# Patient Record
Sex: Female | Born: 1960 | Race: Black or African American | Hispanic: No | Marital: Married | Smoking: Never smoker
Health system: Southern US, Community
[De-identification: ages and names within clinical notes are randomized; demographics above are authoritative.]

## PROBLEM LIST (undated history)

## (undated) DIAGNOSIS — E785 Hyperlipidemia, unspecified: Secondary | ICD-10-CM

## (undated) DIAGNOSIS — M1990 Unspecified osteoarthritis, unspecified site: Secondary | ICD-10-CM

## (undated) HISTORY — DX: Hyperlipidemia, unspecified: E78.5

## (undated) HISTORY — DX: Unspecified osteoarthritis, unspecified site: M19.90

---

## 2001-12-01 ENCOUNTER — Other Ambulatory Visit: Admission: RE | Admit: 2001-12-01 | Discharge: 2001-12-01 | Payer: Self-pay | Admitting: Gynecology

## 2003-02-12 ENCOUNTER — Other Ambulatory Visit: Admission: RE | Admit: 2003-02-12 | Discharge: 2003-02-12 | Payer: Self-pay | Admitting: Gynecology

## 2005-08-14 ENCOUNTER — Other Ambulatory Visit: Admission: RE | Admit: 2005-08-14 | Discharge: 2005-08-14 | Payer: Self-pay | Admitting: Family Medicine

## 2005-08-14 ENCOUNTER — Ambulatory Visit: Payer: Self-pay | Admitting: Family Medicine

## 2005-11-17 ENCOUNTER — Ambulatory Visit: Payer: Self-pay | Admitting: Family Medicine

## 2005-11-27 ENCOUNTER — Ambulatory Visit: Payer: Self-pay | Admitting: Family Medicine

## 2006-01-08 ENCOUNTER — Ambulatory Visit: Payer: Self-pay | Admitting: Family Medicine

## 2007-06-10 ENCOUNTER — Encounter: Payer: Self-pay | Admitting: Family Medicine

## 2007-08-12 ENCOUNTER — Encounter: Payer: Self-pay | Admitting: Family Medicine

## 2007-09-22 ENCOUNTER — Encounter: Payer: Self-pay | Admitting: Family Medicine

## 2007-09-22 ENCOUNTER — Ambulatory Visit: Payer: Self-pay | Admitting: Family Medicine

## 2007-09-22 ENCOUNTER — Other Ambulatory Visit: Admission: RE | Admit: 2007-09-22 | Discharge: 2007-09-22 | Payer: Self-pay | Admitting: Family Medicine

## 2007-09-22 DIAGNOSIS — M069 Rheumatoid arthritis, unspecified: Secondary | ICD-10-CM

## 2007-09-27 LAB — CONVERTED CEMR LAB: Pap Smear: NORMAL

## 2007-10-25 ENCOUNTER — Encounter: Payer: Self-pay | Admitting: Family Medicine

## 2007-10-26 ENCOUNTER — Encounter: Payer: Self-pay | Admitting: Family Medicine

## 2007-10-26 LAB — CONVERTED CEMR LAB: LDL Goal: 160 mg/dL

## 2008-01-10 ENCOUNTER — Encounter: Payer: Self-pay | Admitting: Family Medicine

## 2009-04-18 ENCOUNTER — Encounter: Payer: Self-pay | Admitting: Family Medicine

## 2009-09-09 ENCOUNTER — Ambulatory Visit: Payer: Self-pay | Admitting: Family Medicine

## 2009-09-09 DIAGNOSIS — M25569 Pain in unspecified knee: Secondary | ICD-10-CM | POA: Insufficient documentation

## 2009-09-11 ENCOUNTER — Encounter: Payer: Self-pay | Admitting: Family Medicine

## 2009-09-11 ENCOUNTER — Encounter: Admission: RE | Admit: 2009-09-11 | Discharge: 2009-09-11 | Payer: Self-pay | Admitting: Sports Medicine

## 2009-09-26 ENCOUNTER — Encounter: Payer: Self-pay | Admitting: Family Medicine

## 2010-02-27 ENCOUNTER — Ambulatory Visit: Payer: Self-pay | Admitting: Family Medicine

## 2010-02-27 DIAGNOSIS — E785 Hyperlipidemia, unspecified: Secondary | ICD-10-CM

## 2010-04-03 ENCOUNTER — Ambulatory Visit: Payer: Self-pay | Admitting: Family Medicine

## 2010-05-01 ENCOUNTER — Ambulatory Visit: Payer: Self-pay | Admitting: Family Medicine

## 2010-05-01 DIAGNOSIS — R5383 Other fatigue: Secondary | ICD-10-CM

## 2010-05-01 DIAGNOSIS — R5381 Other malaise: Secondary | ICD-10-CM

## 2010-05-02 LAB — CONVERTED CEMR LAB
ALT: 28 units/L (ref 0–35)
Albumin: 4.2 g/dL (ref 3.5–5.2)
Alkaline Phosphatase: 58 units/L (ref 39–117)
BUN: 12 mg/dL (ref 6–23)
CO2: 23 meq/L (ref 19–32)
Calcium: 9.6 mg/dL (ref 8.4–10.5)
Chloride: 105 meq/L (ref 96–112)
Glucose, Bld: 105 mg/dL — ABNORMAL HIGH (ref 70–99)
HDL: 47 mg/dL (ref >39)
Hemoglobin: 14.2 g/dL (ref 12.0–15.0)
MCHC: 32.6 g/dL (ref 30.0–36.0)
MCV: 95.4 fL (ref 78.0–100.0)
Platelets: 264 10*3/uL (ref 150–400)
RDW: 13.3 % (ref 11.5–15.5)
Sodium: 138 meq/L (ref 135–145)
TSH: 1.042 microintl units/mL (ref 0.350–4.500)
Total Bilirubin: 0.3 mg/dL (ref 0.3–1.2)
Total Protein: 7.7 g/dL (ref 6.0–8.3)
Triglycerides: 209 mg/dL — ABNORMAL HIGH (ref <150)

## 2010-06-02 ENCOUNTER — Ambulatory Visit: Payer: Self-pay | Admitting: Family Medicine

## 2010-07-01 ENCOUNTER — Ambulatory Visit: Payer: Self-pay | Admitting: Family Medicine

## 2010-07-02 LAB — CONVERTED CEMR LAB: Direct LDL: 132 mg/dL — ABNORMAL HIGH

## 2010-07-31 ENCOUNTER — Encounter: Payer: Self-pay | Admitting: Family Medicine

## 2010-12-25 NOTE — Assessment & Plan Note (Signed)
Summary: wt / BP check  Nurse Visit   Vital Signs:  Patient profile:   50 year old female Height:      64 inches Weight:      222 pounds Pulse rate:   100 / minute BP sitting:   119 / 81  (left arm) Cuff size:   large  Vitals Entered By: Kathlene November (June 02, 2010 8:34 AM) CC: weight and BP check. Pt wants to know if you can increase the Phenteramine from 15mg  to the next higher dose- noticing not curbing appetite as well. [facility] [address]   Allergies: No Known Drug Allergies  Orders Added: 1)  Est. Patient Level I [[address]109] Prescriptions: PHENTERMINE HCL 37.5 MG TABS (PHENTERMINE HCL) 1 tab by mouth qAM; take 30 min before breakfast  #30 x 0   Entered and Authorized by:   Seymour Bars DO   Signed by:   Seymour Bars DO on 06/02/2010   Method used:   Printed then faxed to ...       [facility]  [address] [phone]* (retail)       [address] [place], [place]  [address]       Ph: [phone] or [phone]       Fax: [phone]   RxID:   [id number]     Impression & Recommendations:  Problem # 1:  OBESITY, CLASS III (ICD-278.01) Lost another 7 # in 1 month with help of phentermine to diet/ exercise. She wants higher dose as the effects are plateuing.  Will increase her to 37.5 mg once a day. RTC for nurse BP/ wt check in 1 month. F/U Dr Cathey Endow in 2 mos.  Complete Medication List: 1)  Folic Acid 1 Mg Tabs (Folic acid) .... One tab by mouth once a week 2)  Methotrexate 2.5 Mg Tabs (Methotrexate sodium) .... Take 8 tabs every thursday. 3)  Humira Pen 40 Mg/0.88ml Kit (Adalimumab) .... Inj every 2 weeks. 4)  Phentermine Hcl 37.5 Mg Tabs (Phentermine hcl) .Marland Kitchen.. 1 tab by mouth qam; take 30 min before breakfast 5)  Tramadol Hcl 50 Mg Tabs (Tramadol hcl) .Marland Kitchen.. 1 tab by mouth once a day as needed for joint pain 6)  Crestor 10 Mg Tabs (Rosuvastatin calcium) .Marland Kitchen.. 1 tab by mouth qhs

## 2010-12-25 NOTE — Assessment & Plan Note (Signed)
Summary: f/u weight   Vital Signs:  Patient profile:   50 year old female Height:      64 inches Weight:      229 pounds BMI:     39.45 O2 Sat:      99 % on Room air Pulse rate:   76 / minute BP sitting:   138 / 85  (left arm) Cuff size:   large  Vitals Entered By: Payton Spark CMA (May 01, 2010 8:33 AM)  O2 Flow:  Room air CC: F/U weight   Primary Care Provider:  Nani Gasser MD  CC:  F/U weight.  History of Present Illness: 50 yo AAF presents for f/u weight.  She has been on Phentermine x 2 mos now.  She has lost about 19 lbs thus far.  She has noticed that she is able to do portion control and has been eating healthier and is more physically active.  She is doing squats at work.  She denies any insomnia, tremor or heart palptitations.      Current Medications (verified): 1)  Folic Acid 1 Mg  Tabs (Folic Acid) .... One Tab By Mouth Once A Week 2)  Methotrexate 2.5 Mg  Tabs (Methotrexate Sodium) .... Take 8 Tabs Every Thursday. 3)  Humira Pen 40 Mg/0.58ml Kit (Adalimumab) .... Inj Every 2 Weeks. 4)  Phentermine Hcl 15 Mg Caps (Phentermine Hcl) .Marland Kitchen.. 1 Capsule By Mouth Qam, Take 30 Min Before Breakfast 5)  Tramadol Hcl 50 Mg Tabs (Tramadol Hcl) .Marland Kitchen.. 1 Tab By Mouth Once A Day As Needed For Joint Pain  Allergies (verified): No Known Drug Allergies  Physical Exam  General:  alert, well-developed, well-nourished, and well-hydrated.  obese Head:  normocephalic and atraumatic.   Lungs:  Normal respiratory effort, chest expands symmetrically. Lungs are clear to auscultation, no crackles or wheezes. Heart:  Normal rate and regular rhythm. S1 and S2 normal without gallop, murmur, click, rub or other extra sounds. Neurologic:  no tremor Skin:  color normal.   Psych:  good eye contact, not anxious appearing, and not depressed appearing.     Impression & Recommendations:  Problem # 1:  DYSLIPIDEMIA (ICD-272.4)  Update fasting labs today to see if wt loss has  helped improve her numbers.  She is NOT taking her statin. The following medications were removed from the medication list:    Crestor 10 Mg Tabs (Rosuvastatin calcium) .Marland Kitchen... Take 1 tablet by mouth once a day at bedtime  Lipid Goals: Chol Goal: 200 (10/26/2007)   HDL Goal: 40 (10/26/2007)   LDL Goal: 160 (10/26/2007)   TG Goal: 150 (10/26/2007)  Prior 10 Yr Risk Heart Disease: 4 % (10/26/2007)   HDL:44 (10/25/2007)  LDL:174 (10/25/2007)  Chol:264 (10/25/2007)  Trig:228 (10/25/2007)  Orders: T-Lipid Profile (11914-78295)  Problem # 2:  OBESITY, CLASS III (ICD-278.01) BMI 39 c/w class II obesity, total wt loss of 19 lbs in 2 mos with addition of Phentermine to healthy diet and regular exercise.  BP at goal and doing quite well so will continue Phentermine at 15 mg once a day. Nurse BP / wt check in 1 month.  Complete Medication List: 1)  Folic Acid 1 Mg Tabs (Folic acid) .... One tab by mouth once a week 2)  Methotrexate 2.5 Mg Tabs (Methotrexate sodium) .... Take 8 tabs every thursday. 3)  Humira Pen 40 Mg/0.34ml Kit (Adalimumab) .... Inj every 2 weeks. 4)  Phentermine Hcl 15 Mg Caps (Phentermine hcl) .Marland Kitchen.. 1 capsule by mouth  qam, take 30 min before breakfast 5)  Tramadol Hcl 50 Mg Tabs (Tramadol hcl) .Marland Kitchen.. 1 tab by mouth once a day as needed for joint pain  Other Orders: T-Comprehensive Metabolic Panel ([id number]) T-TSH (09811-91478) T-CBC No Diff ([id number])  Patient Instructions: 1)  Phentermine RFd. 2)  Continue to work on healthy diet- 3)  Focus on [employer] Proteins, fruits and veggies. 4)  Avoid snacking and sugary drinks. 5)  Return for a weight check in 1 month (nurse visit). 6)  Labs today. 7)  Will call you w/ results tomorrow. Prescriptions: PHENTERMINE HCL 15 MG CAPS (PHENTERMINE HCL) 1 capsule by mouth qAM, take 30 min before breakfast  #30 x 0   Entered and Authorized by:   Seymour Bars DO   Signed by:   Seymour Bars DO on 05/01/2010   Method used:   Printed then  faxed to ...       [facility]  [address] [phone]* (retail)       [address] [place], [place]  [address]       Ph: [phone] or [phone]       Fax: [phone]   RxID:   [id number]

## 2010-12-25 NOTE — Letter (Signed)
Summary: [facility]  [facility]   Imported By: Lanelle Bal 08/19/2010 12:04:39  _____________________________________________________________________  External Attachment:    Type:   Image     Comment:   External Document

## 2010-12-25 NOTE — Assessment & Plan Note (Signed)
Summary: weight and BP check//mpm  Nurse Visit   Vital Signs:  Patient profile:   50 year old female Height:      64 inches Weight:      219 pounds BMI:     37.73 O2 Sat:      99 % on Room air Pulse rate:   88 / minute BP sitting:   124 / 71  (right arm) Cuff size:   large  Vitals Entered By: Payton Spark CMA (July 01, 2010 9:54 AM)  O2 Flow:  Room air CC: F/U weight and HTN.   Current Medications (verified): 1)  Folic Acid 1 Mg  Tabs (Folic Acid) .... One Tab By Mouth Once A Week 2)  Methotrexate 2.5 Mg  Tabs (Methotrexate Sodium) .... Take 8 Tabs Every Thursday. 3)  Humira Pen 40 Mg/0.79ml Kit (Adalimumab) .... Inj Every 2 Weeks. 4)  Phentermine Hcl 37.5 Mg Tabs (Phentermine Hcl) .Marland Kitchen.. 1 Tab By Mouth Qam; Take 30 Min Before Breakfast 5)  Tramadol Hcl 50 Mg Tabs (Tramadol Hcl) .Marland Kitchen.. 1 Tab By Mouth Once A Day As Needed For Joint Pain 6)  Crestor 10 Mg Tabs (Rosuvastatin Calcium) .Marland Kitchen.. 1 Tab By Mouth Qhs  Allergies (verified): No Known Drug Allergies  Orders Added: 1)  Est. Patient Level I [84696] 2)  T-Lipoprotein (LDL cholesterol)  [29528-41324] 3)  T-AST/SGOT [40102-72536] 4)  T-ALT/SGPT [64403-47425] Prescriptions: PHENTERMINE HCL 37.5 MG TABS (PHENTERMINE HCL) 1 tab by mouth qAM; take 30 min before breakfast  #30 x 0   Entered and Authorized by:   Seymour Bars DO   Signed by:   Seymour Bars DO on 07/01/2010   Method used:   Printed then faxed [facility] ...       [facility]  [address] [phone]* (retail)       [address] [place], [place]  [address]       Ph: [phone] or [phone]       Fax: [phone]   RxID:   [id number]     Patient Instructions: 1)  Phentermine RFd for another month. 2)  3 # lost in the last month. 3)  Expect at least 1 # lost/ wk. 4)  Work on healthy diet and 1 hr of exercise 5 days/ wk. 5)  Labs today. 6)  Will call you w/ results tomorrow. 7)  If you have not lost at least 4# at [facility] / BP check next month, will discontinue  Phentermine.

## 2010-12-25 NOTE — Assessment & Plan Note (Signed)
Summary: Wt check  Nurse Visit   Vital Signs:  Patient profile:   50 year old female Height:      64 inches Weight:      232 pounds BMI:     39.97 Pulse rate:   69 / minute BP sitting:   135 / 77  (left arm) Cuff size:   large  Vitals Entered By: Payton Spark CMA (Apr 03, 2010 10:07 AM)  Allergies: No Known Drug Allergies  Orders Added: 1)  Est. Patient Level I [16109] Prescriptions: PHENTERMINE HCL 15 MG CAPS (PHENTERMINE HCL) 1 capsule by mouth qAM, take 30 min before breakfast  #30 x 0   Entered and Authorized by:   Seymour Bars DO   Signed by:   Seymour Bars DO on 04/03/2010   Method used:   Printed then faxed [facility] ...       [facility]  [address] [phone]* (retail)       [address] [place], [place]  [address]       Ph: [phone] or [phone]       Fax: [phone]   RxID:   [id number]    CC: Follow-up HTN HPI: Taking meds? yes Side effects? no Chest pain, SOB, Dizziness? no A/P: HTN (401.1)  At goal? If no, Patient will be notified.  5 minutes was spent with patient.    Impression & Recommendations:  Problem # 1:  OBESITY, CLASS III (ICD-278.01) Excellent wt loss 243--> 232 in 1 month on Phentermine 15 mg/ day.  Doing great w/o SEs.   BP at goal.  11 lbs lost thus far.  OV in 1 mo.  Complete Medication List: 1)  Folic Acid 1 Mg Tabs (Folic acid) .... One tab by mouth once a week 2)  Methotrexate 2.5 Mg Tabs (Methotrexate sodium) .... Take 8 tabs every thursday. 3)  Crestor 10 Mg Tabs (Rosuvastatin calcium) .... Take 1 tablet by mouth once a day at bedtime 4)  Humira Pen 40 Mg/0.22ml Kit (Adalimumab) .... Inj every 2 weeks. 5)  Phentermine Hcl 15 Mg Caps (Phentermine hcl) .Marland Kitchen.. 1 capsule by mouth qam, take 30 min before breakfast 6)  Tramadol Hcl 50 Mg Tabs (Tramadol hcl) .Marland Kitchen.. 1 tab by mouth once a day as needed for joint pain  Appended Document: Wt check Pt aware

## 2010-12-25 NOTE — Assessment & Plan Note (Signed)
Summary: weight mgmt   Vital Signs:  Patient profile:   50 year old female Height:      64 inches Weight:      243 pounds BMI:     41.86 O2 Sat:      98 % on Room air Pulse rate:   80 / minute BP sitting:   129 / 79  (left arm) Cuff size:   large  Vitals Entered By: Payton Spark CMA (February 27, 2010 9:50 AM)  O2 Flow:  Room air CC: Weight management. Would also like to know if Crestor can be changed to simvastatin.    Primary Care Provider:  Nani Gasser MD  CC:  Weight management. Would also like to know if Crestor can be changed to simvastatin. Marland Kitchen  History of Present Illness: 50 yo AAF presents for weight managment.  She has struggled with her weight mainly since pregnancy in the mid 1980s.  Her weight history is as follows:  In 6th grade 143 in HS 183 In college lost wt. 1985- 189 at the end of pregnancy.  did not lose baby weight.   then 8 lbs/ year gain ever since.    She has knee pain from RA.  This limits her ability to exercise.  She really enjoys walking.  Has not tried to pre-treat with NSAIDs or tylenol or use a knee brace.  Denies limping.  Her husband is trying to eat healither.  She has tried Weight Watchers in the past, OTC diet pills.  Has used diuretics.  She is ready to really get some weight off as she is aware of the health effects.    Has hx of dyslipidemia.   Current Medications (verified): 1)  Folic Acid 1 Mg  Tabs (Folic Acid) .... One Tab By Mouth Once A Week 2)  Methotrexate 2.5 Mg  Tabs (Methotrexate Sodium) .... Take 8 Tabs Every Thursday. 3)  Crestor 10 Mg  Tabs (Rosuvastatin Calcium) .... Take 1 Tablet By Mouth Once A Day At Bedtime 4)  Humira Pen 40 Mg/0.72ml Kit (Adalimumab) .... Inj Every 2 Weeks.  Allergies (verified): No Known Drug Allergies  Past History:  Past Medical History: Reviewed history from 09/22/2007 and no changes required. Iritis from RA.   Social History: Reviewed history from 09/22/2007 and no changes  required. 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  Married to [employer].  48 yr old son.  Never smoked.  No EtOH, no drugs, 2 caffeinated drinks per day.  Walks 1x per week for 40 min.  No birth control, had great difficulty conceiving.  Review of Systems General:  Denies fatigue, loss of appetite, and sleep disorder. CV:  Denies chest pain or discomfort, palpitations, shortness of breath with exertion, and swelling of feet. Resp:  Denies cough. GI:  Denies abdominal pain, indigestion, and nausea. MS:  Complains of joint pain. Psych:  Denies depression.  Physical Exam  General:  obese WF in NAD Head:  normocephalic and atraumatic.   Eyes:  wears glasses Nose:  no nasal discharge.   Mouth:  pharynx pink and moist.   Neck:  no masses.   Lungs:  Normal respiratory effort, chest expands symmetrically. Lungs are clear to auscultation, no crackles or wheezes. Heart:  Normal rate and regular rhythm. S1 and S2 normal without gallop, murmur, click, rub or other extra sounds. Abdomen:  soft, non-tender, no masses, no hepatomegaly, and no splenomegaly.   Pulses:  2+ radial and pedal pulses Extremities:  no E/C/C Skin:  color normal.   Psych:  good eye contact, not anxious appearing, and not depressed appearing.     Impression & Recommendations:  Problem # 1:  DYSLIPIDEMIA (ICD-272.4) Reviewed labs from 08 with FLP c/w metabolic syndrome -- high TGs, low HDL.   She wants to try weight loss x 8 wks prior to rechecking fasting sugar and lipids.   Update labs at 2 mos f/u visit.  Her updated medication list for this problem includes:    Crestor 10 Mg Tabs (Rosuvastatin calcium) .Marland Kitchen... Take 1 tablet by mouth once a day at bedtime  Problem # 2:  OBESITY, CLASS III (ICD-278.01) BMI 41 c/w class III obesity.  counseled 20 min face to face on weight loss management plan, obtacles to weight loss. She agrees to start walking 3+ days/ wk for 45+ minutes and can pre-treat with Ibuprofen 600 mg 30 min  prior to her walk and use a knee sleeve for RA knee pain.  She can mix it up with use of recumbent bike, elliptical or water aerobics at the gym to fill in 5 days/ wk with exercise.  We reviewed a plan to change highest calorie meals to the AM and eat a light dinner, avoiding late night snacks.  EKG today shows a normal QT interval, NSR, no ischmeia, normal axis.  She agrees to try Phentermine as appetite suppressant in the AM.  Call if any problems.  RTC for nurse visit - wt and BP check in 4 wks with OV in 8 wks. Orders: EKG w/ Interpretation (93000)  Complete Medication List: 1)  Folic Acid 1 Mg Tabs (Folic acid) .... One tab by mouth once a week 2)  Methotrexate 2.5 Mg Tabs (Methotrexate sodium) .... Take 8 tabs every thursday. 3)  Crestor 10 Mg Tabs (Rosuvastatin calcium) .... Take 1 tablet by mouth once a day at bedtime 4)  Humira Pen 40 Mg/0.57ml Kit (Adalimumab) .... Inj every 2 weeks. 5)  Phentermine Hcl 15 Mg Caps (Phentermine hcl) .Marland Kitchen.. 1 capsule by mouth qam, take 30 min before breakfast 6)  Tramadol Hcl 50 Mg Tabs (Tramadol hcl) .Marland Kitchen.. 1 tab by mouth once a day as needed for joint pain  Patient Instructions: 1)  EKG normal. 2)  Start Phentermine 15 mg every AM, take on empty stomach 30 min before breakast. 3)  Work on getting 45+ min of exercise 5 days/ wk. 4)  Use Small plate sizes. 5)  Eat a small dinner and avoid eating > 2 hrs before going to bed. 6)  Avoid any sweetened drinks including juice. 7)  Work on eating lean protein at each meal. 8)  Avoid any snacking. 9)  RTC for nurse BP/ wt check in 4 wks. 10)  Return to see me in 2 mos. Prescriptions: TRAMADOL HCL 50 MG TABS (TRAMADOL HCL) 1 tab by mouth once a day as needed for joint pain  #30 x 1   Entered and Authorized by:   Seymour Bars DO   Signed by:   Seymour Bars DO on 02/27/2010   Method used:   [facility]        [facility]  [address] [phone][facility] (retail)       [address] [place], [place]  [address]        Ph: [phone] or [phone]       Fax: [phone]   RxID:   [id number] PHENTERMINE HCL 15 MG CAPS (PHENTERMINE HCL) 1 capsule by mouth qAM,  take 30 min before breakfast  #30 x 0   Entered and Authorized by:   Seymour Bars DO   Signed by:   Seymour Bars DO on 02/27/2010   Method used:   Printed then faxed to ...       [facility]  [address] [phone]* (retail)       [address] [place], [place]  [address]       Ph: [phone] or [phone]       Fax: [phone]   RxID:   [id number]

## 2011-04-07 ENCOUNTER — Encounter: Payer: Self-pay | Admitting: Family Medicine

## 2012-09-08 ENCOUNTER — Telehealth: Payer: Self-pay | Admitting: Family Medicine

## 2012-09-08 ENCOUNTER — Other Ambulatory Visit (HOSPITAL_COMMUNITY)
Admission: RE | Admit: 2012-09-08 | Discharge: 2012-09-08 | Disposition: A | Discharge status: Home / Self Care | Payer: Managed Care, Other (non HMO) | Source: Ambulatory Visit | Attending: Family Medicine | Admitting: Family Medicine

## 2012-09-08 ENCOUNTER — Ambulatory Visit (INDEPENDENT_AMBULATORY_CARE_PROVIDER_SITE_OTHER): Payer: Managed Care, Other (non HMO) | Admitting: Family Medicine

## 2012-09-08 VITALS — BP 183/88 | HR 73 | Ht 64.0 in | Wt 257.0 lb

## 2012-09-08 DIAGNOSIS — Z1231 Encounter for screening mammogram for malignant neoplasm of breast: Secondary | ICD-10-CM

## 2012-09-08 DIAGNOSIS — Z01419 Encounter for gynecological examination (general) (routine) without abnormal findings: Secondary | ICD-10-CM | POA: Insufficient documentation

## 2012-09-08 DIAGNOSIS — M069 Rheumatoid arthritis, unspecified: Secondary | ICD-10-CM

## 2012-09-08 DIAGNOSIS — Z1151 Encounter for screening for human papillomavirus (HPV): Secondary | ICD-10-CM | POA: Insufficient documentation

## 2012-09-08 DIAGNOSIS — H9319 Tinnitus, unspecified ear: Secondary | ICD-10-CM

## 2012-09-08 DIAGNOSIS — N912 Amenorrhea, unspecified: Secondary | ICD-10-CM

## 2012-09-08 MED ORDER — AMOXICILLIN-POT CLAVULANATE 875-125 MG PO TABS: 1.0000 | ORAL_TABLET | Freq: Two times a day (BID) | ORAL | Status: DC

## 2012-09-08 NOTE — Telephone Encounter (Signed)
Call patient and let her know that I want to recheck her blood pressure when she follows up for her ear in about 2 weeks. Make sure eating a low salt diet. Is very high today. Him and to address this to her during her visit and forgot.

## 2012-09-08 NOTE — Patient Instructions (Addendum)
Start a regular exercise program and make sure you are eating a healthy diet Try to eat 4 servings of dairy a day  Your vaccines are up to date.   

## 2012-09-08 NOTE — Progress Notes (Signed)
Subjective:     Janet Galloway is a 51 y.o. female and is here for a comprehensive physical exam. The patient reports no problems.  She has had some ringing in her left ear for at least the last month and a half. She says it feels somewhat like her ear is under water. She's also noticed that she'll have to move her phone to her right ear to be able to hear well on the speaker. She denies any actual pain fever or drainage from the ear.  Her last period was him as to year ago she has had some occasional spotting but not in a cyclical pattern. She wonders if she could be going through menopause. She's also due for her mammogram.  History   Social History  . Marital Status: Married    Spouse Name: N/A    Number of Children: N/A  . Years of Education: N/A   Occupational History  . Not on file.   Social History Main Topics  . Smoking status: Not on file  . Smokeless tobacco: Not on file  . Alcohol Use: Not on file  . Drug Use: Not on file  . Sexually Active: Not on file   Other Topics Concern  . Not on file   Social History Narrative  . No narrative on file   Health Maintenance  Topic Date Due  . Tetanus/tdap  11/07/1982  . Influenza Vaccine  07/24/2012  . Pap Smear  09/09/2015    The following portions of the patient's history were reviewed and updated as appropriate: allergies, current medications, past family history, past medical history, past social history, past surgical history and problem list.  Review of Systems A comprehensive review of systems was negative.   Objective:    BP 183/88  Pulse 73  Ht 5\' 4"  (1.626 m)  Wt 257 lb (116.574 kg)  BMI 44.11 kg/m2 General appearance: alert, cooperative, appears stated age and mildly obese Head: Normocephalic, without obvious abnormality, atraumatic Eyes: conj clear, EOMi, PEERLA Ears: Left Tm and canal is clear.  Right canal is clear.  Right TM with fluid, but no erythema or driange.  Good light reflex.   Nose:  Nares normal. Septum midline. Mucosa normal. No drainage or sinus tenderness. Throat: lips, mucosa, and tongue normal; teeth and gums normal Neck: no adenopathy, no carotid bruit, no JVD, supple, symmetrical, trachea midline and thyroid not enlarged, symmetric, no tenderness/mass/nodules Back: no kyphosis present, symmetric, no curvature. ROM normal. No CVA tenderness. Lungs: clear to auscultation bilaterally Breasts: normal appearance, no masses or tenderness Heart: regular rate and rhythm, S1, S2 normal, no murmur, click, rub or gallop Abdomen: soft, non-tender; bowel sounds normal; no masses,  no organomegaly Pelvic: cervix normal in appearance, external genitalia normal, no adnexal masses or tenderness, no cervical motion tenderness, rectovaginal septum normal, uterus normal size, shape, and consistency and vagina normal without discharge Extremities: extremities normal, atraumatic, no cyanosis or edema Pulses: 2+ and symmetric Skin: Skin color, texture, turgor normal. No rashes or lesions Lymph nodes: Cervical, supraclavicular, and axillary nodes normal. Neurologic: Alert and oriented X 3, normal strength and tone. Normal symmetric reflexes. Normal coordination and gait    Assessment:    Healthy female exam.      Plan:     See After Visit Summary for Counseling Recommendations  Start a regular exercise program and make sure you are eating a healthy diet Try to eat 4 servings of dairy a day  Your vaccines are up to  date.  Order placed for mammogram. Well do hormone testing to see if she is fully postmenopausal.   Left ear tinnitus-fluid on the left ear. No sign of infection or drainage.  She denies any allergy sxs. Tympanometry is abnormal in the left ear. We'll go ahead and treat with a trial of Augmentin. Follow up in 2 weeks to recheck your to see if the fluid is resolved.  Elevated blood pressure-she has no prior diagnosis. We'll recheck her blood pressure in 2 weeks for  followup.  Declined flu vaccine.

## 2012-09-09 ENCOUNTER — Encounter: Payer: Self-pay | Admitting: Family Medicine

## 2012-09-09 NOTE — Telephone Encounter (Signed)
Pt notiifed of MD instructions

## 2012-09-13 ENCOUNTER — Encounter: Payer: Self-pay | Admitting: *Deleted

## 2012-09-22 ENCOUNTER — Ambulatory Visit: Payer: Managed Care, Other (non HMO) | Admitting: Family Medicine

## 2012-09-22 DIAGNOSIS — Z0289 Encounter for other administrative examinations: Secondary | ICD-10-CM

## 2012-09-27 ENCOUNTER — Ambulatory Visit (INDEPENDENT_AMBULATORY_CARE_PROVIDER_SITE_OTHER): Payer: Managed Care, Other (non HMO)

## 2012-09-27 DIAGNOSIS — Z1231 Encounter for screening mammogram for malignant neoplasm of breast: Secondary | ICD-10-CM

## 2012-10-03 ENCOUNTER — Ambulatory Visit (INDEPENDENT_AMBULATORY_CARE_PROVIDER_SITE_OTHER): Payer: Managed Care, Other (non HMO) | Admitting: Family Medicine

## 2012-10-03 ENCOUNTER — Encounter: Payer: Self-pay | Admitting: Family Medicine

## 2012-10-03 VITALS — BP 141/85 | HR 75 | Ht 64.0 in | Wt 255.0 lb

## 2012-10-03 DIAGNOSIS — H938X9 Other specified disorders of ear, unspecified ear: Secondary | ICD-10-CM

## 2012-10-03 DIAGNOSIS — I10 Essential (primary) hypertension: Secondary | ICD-10-CM | POA: Insufficient documentation

## 2012-10-03 NOTE — Progress Notes (Signed)
  Subjective:    Patient ID: Janet Galloway, female    DOB: 1961-11-03, 51 y.o.   MRN: [id number]  HPI Here for followup blood pressure. She was seen a couple weeks ago during her annual physical exam. Her blood pressure was quite elevated with a systolic of 180. She has gained 40 pounds in the last year or so. She does feel it is most likely contributing factor. She also eats a very high salt diet. She wants and if she can retry phentermine for weight loss. She took it back in 2011 with Dr. Seymour Bars and did well on it.  She still having difficulty hearing out of her left ear. There is no pain but just feels plugged. Infection cannot wear her ear piece, ear during meetings because she cannot hear well.   Review of Systems     Objective:   Physical Exam  Constitutional: She appears well-developed and well-nourished.  HENT:       Left Tm appears normal. A little dull but she has a good light reflex unable to visualize the ossicle. No actual fluid level seen.  Cardiovascular: Normal rate, regular rhythm and normal heart sounds.   Pulmonary/Chest: Effort normal and breath sounds normal.  Skin: Skin is warm and dry.  Psychiatric: She has a normal mood and affect. Her behavior is normal.          Assessment & Plan:  Hypertension-uncontrolled. New diagnosis. We discussed the importance of getting this under control. We also discussed the DASH diet. Handout given. She wants to hold off on taking a blood pressure medication for now and really try to work on exercise and diet over the next month. Explained her that happy to report on the phentermine but that her blood pressure has been well controlled to start. Having elevated blood pressures contraindication and the phentermine can certainly make that worse because it is a stimulant. Followup in one month.  Left ear feeling plugged-I will refer her to ENT at this point. We did a course of Augmentin she did not improve. She denies any  allergy symptoms. I suggested possibly a nasal steroid spray and antihistamine but she feels strongly that she has not had any allergy symptoms to cause her ear fullness sensation. Referral made.

## 2012-10-03 NOTE — Patient Instructions (Signed)
DASH Diet  The DASH diet stands for "Dietary Approaches to Stop Hypertension." It is a healthy eating plan that has been shown to reduce high blood pressure (hypertension) in as little as 14 days, while also possibly providing other significant health benefits. These other health benefits include reducing the risk of breast cancer after menopause and reducing the risk of type 2 diabetes, heart disease, colon cancer, and stroke. Health benefits also include weight loss and slowing kidney failure in patients with chronic kidney disease.   DIET GUIDELINES  · Limit salt (sodium). Your diet should contain less than 1500 mg of sodium daily.  · Limit refined or processed carbohydrates. Your diet should include mostly whole grains. Desserts and added sugars should be used sparingly.  · Include small amounts of heart-healthy fats. These types of fats include nuts, oils, and tub margarine. Limit saturated and trans fats. These fats have been shown to be harmful in the body.  CHOOSING FOODS   The following food groups are based on a 2000 calorie diet. See your Registered Dietitian for individual calorie needs.  Grains and Grain Products (6 to 8 servings daily)  · Eat More Often: Whole-wheat bread, brown rice, whole-grain or wheat pasta, quinoa, popcorn without added fat or salt (air popped).  · Eat Less Often: White bread, white pasta, white rice, cornbread.  Vegetables (4 to 5 servings daily)  · Eat More Often: Fresh, frozen, and canned vegetables. Vegetables may be raw, steamed, roasted, or grilled with a minimal amount of fat.  · Eat Less Often/Avoid: Creamed or fried vegetables. Vegetables in a cheese sauce.  Fruit (4 to 5 servings daily)  · Eat More Often: All fresh, canned (in natural juice), or frozen fruits. Dried fruits without added sugar. One hundred percent fruit juice (½ cup [237 mL] daily).  · Eat Less Often: Dried fruits with added sugar. Canned fruit in light or heavy syrup.  Lean Meats, Fish, and Poultry (2  servings or less daily. One serving is 3 to 4 oz [85-114 g]).  · Eat More Often: Ninety percent or leaner ground beef, tenderloin, sirloin. Round cuts of beef, chicken breast, turkey breast. All fish. Grill, bake, or broil your meat. Nothing should be fried.  · Eat Less Often/Avoid: Fatty cuts of meat, turkey, or chicken leg, thigh, or wing. Fried cuts of meat or fish.  Dairy (2 to 3 servings)  · Eat More Often: Low-fat or fat-free milk, low-fat plain or light yogurt, reduced-fat or part-skim cheese.  · Eat Less Often/Avoid: Milk (whole, 2%). Whole milk yogurt. Full-fat cheeses.  Nuts, Seeds, and Legumes (4 to 5 servings per week)  · Eat More Often: All without added salt.  · Eat Less Often/Avoid: Salted nuts and seeds, canned beans with added salt.  Fats and Sweets (limited)  · Eat More Often: Vegetable oils, tub margarines without trans fats, sugar-free gelatin. Mayonnaise and salad dressings.  · Eat Less Often/Avoid: Coconut oils, palm oils, butter, stick margarine, cream, half and half, cookies, candy, pie.  FOR MORE INFORMATION  The Dash Diet Eating Plan: www.dashdiet.org  Document Released: 10/29/2011 Document Revised: 02/01/2012 Document Reviewed: 10/29/2011  ExitCare® Patient Information ©2013 [employer].  DASH Diet  The DASH diet stands for "Dietary Approaches to Stop Hypertension." It is a healthy eating plan that has been shown to reduce high blood pressure (hypertension) in as little as 14 days, while also possibly providing other significant health benefits. These other health benefits include reducing the risk of breast cancer after   menopause and reducing the risk of type 2 diabetes, heart disease, colon cancer, and stroke. Health benefits also include weight loss and slowing kidney failure in patients with chronic kidney disease.   DIET GUIDELINES  · Limit salt (sodium). Your diet should contain less than 1500 mg of sodium daily.  · Limit refined or processed carbohydrates. Your diet should include  mostly whole grains. Desserts and added sugars should be used sparingly.  · Include small amounts of heart-healthy fats. These types of fats include nuts, oils, and tub margarine. Limit saturated and trans fats. These fats have been shown to be harmful in the body.  CHOOSING FOODS   The following food groups are based on a 2000 calorie diet. See your Registered Dietitian for individual calorie needs.  Grains and Grain Products (6 to 8 servings daily)  · Eat More Often: Whole-wheat bread, brown rice, whole-grain or wheat pasta, quinoa, popcorn without added fat or salt (air popped).  · Eat Less Often: White bread, white pasta, white rice, cornbread.  Vegetables (4 to 5 servings daily)  · Eat More Often: Fresh, frozen, and canned vegetables. Vegetables may be raw, steamed, roasted, or grilled with a minimal amount of fat.  · Eat Less Often/Avoid: Creamed or fried vegetables. Vegetables in a cheese sauce.  Fruit (4 to 5 servings daily)  · Eat More Often: All fresh, canned (in natural juice), or frozen fruits. Dried fruits without added sugar. One hundred percent fruit juice (½ cup [237 mL] daily).  · Eat Less Often: Dried fruits with added sugar. Canned fruit in light or heavy syrup.  Lean Meats, Fish, and Poultry (2 servings or less daily. One serving is 3 to 4 oz [85-114 g]).  · Eat More Often: Ninety percent or leaner ground beef, tenderloin, sirloin. Round cuts of beef, chicken breast, turkey breast. All fish. Grill, bake, or broil your meat. Nothing should be fried.  · Eat Less Often/Avoid: Fatty cuts of meat, turkey, or chicken leg, thigh, or wing. Fried cuts of meat or fish.  Dairy (2 to 3 servings)  · Eat More Often: Low-fat or fat-free milk, low-fat plain or light yogurt, reduced-fat or part-skim cheese.  · Eat Less Often/Avoid: Milk (whole, 2%). Whole milk yogurt. Full-fat cheeses.  Nuts, Seeds, and Legumes (4 to 5 servings per week)  · Eat More Often: All without added salt.  · Eat Less Often/Avoid: Salted  nuts and seeds, canned beans with added salt.  Fats and Sweets (limited)  · Eat More Often: Vegetable oils, tub margarines without trans fats, sugar-free gelatin. Mayonnaise and salad dressings.  · Eat Less Often/Avoid: Coconut oils, palm oils, butter, stick margarine, cream, half and half, cookies, candy, pie.  FOR MORE INFORMATION  The Dash Diet Eating Plan: www.dashdiet.org  Document Released: 10/29/2011 Document Revised: 02/01/2012 Document Reviewed: 10/29/2011  ExitCare® Patient Information ©2013 [employer].

## 2012-10-10 ENCOUNTER — Encounter: Payer: Self-pay | Admitting: *Deleted

## 2012-11-08 ENCOUNTER — Encounter: Payer: Self-pay | Admitting: Family Medicine

## 2012-11-08 ENCOUNTER — Ambulatory Visit (INDEPENDENT_AMBULATORY_CARE_PROVIDER_SITE_OTHER): Payer: Managed Care, Other (non HMO) | Admitting: Family Medicine

## 2012-11-08 VITALS — BP 155/92 | HR 90 | Ht 64.0 in | Wt 257.0 lb

## 2012-11-08 DIAGNOSIS — E669 Obesity, unspecified: Secondary | ICD-10-CM

## 2012-11-08 DIAGNOSIS — I10 Essential (primary) hypertension: Secondary | ICD-10-CM

## 2012-11-08 MED ORDER — LISINOPRIL 10 MG PO TABS: 10.0000 mg | ORAL_TABLET | Freq: Every day | ORAL | Status: DC

## 2012-11-08 MED ORDER — PHENTERMINE HCL 37.5 MG PO CAPS: 37.5000 mg | ORAL_CAPSULE | ORAL | Status: DC

## 2012-11-08 NOTE — Progress Notes (Signed)
  Subjective:    Patient ID: Janet Galloway, female    DOB: 1961-02-18, 51 y.o.   MRN: [id number]  HPI Here to followup blood pressure. She says overall she's doing well she is asymptomatic. It was elevated at her last visit we did discuss the DASH diet. She went to work on this and then come back to every check her blood pressure. She has seen Dr. Genelle Bal, ENT since I last saw her. 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 They recommended steroid injections into the tympanic membrane into the middle ear versus actually getting hearing aid at some point if it gets worse. She says that when she was there his systolic blood pressure was in the 130s. She denies any chest pain or short of breath. She still wants to get back on phentermine for weight loss. She did very well with it previously the past with Dr. Seymour Bars. She was on 37.5 mg and had about a 40 pound weight loss with the medication. She says she does plan on starting a walking program daily in the mornings.    Review of Systems     Objective:   Physical Exam  Constitutional: She is oriented to person, place, and time. She appears well-developed and well-nourished.  HENT:  Head: Normocephalic and atraumatic.  Cardiovascular: Normal rate, regular rhythm and normal heart sounds.   Pulmonary/Chest: Effort normal and breath sounds normal.  Neurological: She is alert and oriented to person, place, and time.  Skin: Skin is warm and dry.  Psychiatric: She has a normal mood and affect. Her behavior is normal.          Assessment & Plan:  Hypertension-new diagnosis. We discussed the options of starting medication. Will start lisinopril 10 mg. We discussed potential side effects. Followup in one month to recheck blood pressure.  Obesity, class III-discussed starting phentermine. Went to her the importance of keeping her blood pressure control. We will recheck that on the phentermine in one month. She's  to stop immediately if she expenses any chest pain or shortness of breath. We also discussed potential side effects even though she has taken it before and tolerated it well or reviewed these with her again. If at that point her blood pressure looks great and she is losing weight and she has started a walking program and she can followup for monthly visits with the nurse. Did encourage her also get back on a dietary program to maximize her weight loss.  Goals: Daily walking in the morning, dietary change.

## 2012-11-18 ENCOUNTER — Other Ambulatory Visit: Payer: Self-pay | Admitting: *Deleted

## 2012-11-18 ENCOUNTER — Ambulatory Visit (INDEPENDENT_AMBULATORY_CARE_PROVIDER_SITE_OTHER): Payer: Managed Care, Other (non HMO)

## 2012-11-18 DIAGNOSIS — R748 Abnormal levels of other serum enzymes: Secondary | ICD-10-CM

## 2012-11-18 DIAGNOSIS — K8020 Calculus of gallbladder without cholecystitis without obstruction: Secondary | ICD-10-CM

## 2012-11-18 DIAGNOSIS — E7889 Other lipoprotein metabolism disorders: Secondary | ICD-10-CM

## 2012-12-09 ENCOUNTER — Encounter: Payer: Self-pay | Admitting: Family Medicine

## 2012-12-09 ENCOUNTER — Ambulatory Visit: Payer: Managed Care, Other (non HMO) | Admitting: Family Medicine

## 2012-12-09 ENCOUNTER — Ambulatory Visit (INDEPENDENT_AMBULATORY_CARE_PROVIDER_SITE_OTHER): Payer: Managed Care, Other (non HMO) | Admitting: Family Medicine

## 2012-12-09 VITALS — BP 132/80 | HR 96 | Resp 18 | Wt 243.0 lb

## 2012-12-09 DIAGNOSIS — I10 Essential (primary) hypertension: Secondary | ICD-10-CM

## 2012-12-09 DIAGNOSIS — E669 Obesity, unspecified: Secondary | ICD-10-CM

## 2012-12-09 MED ORDER — PHENTERMINE HCL 37.5 MG PO CAPS: 37.5000 mg | ORAL_CAPSULE | ORAL | Status: DC

## 2012-12-09 MED ORDER — LISINOPRIL 10 MG PO TABS: 10.0000 mg | ORAL_TABLET | Freq: Every day | ORAL | Status: DC

## 2012-12-09 NOTE — Progress Notes (Signed)
  Subjective:    Patient ID: Janet Galloway, female    DOB: 01/20/1961, 52 y.o.   MRN: [id number]  HPI HTN- Pt denies chest pain, SOB, dizziness, or heart palpitations.  Taking meds as directed w/o problems.  Denies medication side effects.  Has really but back on salt.    Obesity -  Unfortunately she injured her right knee and has not been able to walk. Noticed a little bit better what her to continue her momentum of weight loss.  She has been really working on controlling her diet and portion sizes. She reports treatment and plan fluids. In fact she's actually lost 14 pounds since I saw her a month ago. She denies any chest pain or shortness of breath or symptoms such as anxiousness on the phentermine.   Review of Systems     Objective:   Physical Exam  Constitutional: She is oriented to person, place, and time. She appears well-developed and well-nourished.  HENT:  Head: Normocephalic and atraumatic.  Cardiovascular: Normal rate, regular rhythm and normal heart sounds.   Pulmonary/Chest: Effort normal and breath sounds normal.  Neurological: She is alert and oriented to person, place, and time.  Skin: Skin is warm and dry.  Psychiatric: She has a normal mood and affect. Her behavior is normal.          Assessment & Plan:  HTN- Well controlled Tolerating ACEi well.  Followup in one month for blood pressure check. She's tolerating the medication well without side effects.  Obesity, class 3 - encouraged her to start an exercise routine. Unfortunately she injured her right knee and has not been able to walk. Noticed a little bit better what her to continue her momentum of weight loss. Continue with her dietary changes. Make sure drinking plenty water and fluids and avoiding dehydration. Followup in one month for nurse visit for blood pressure and weight check. Refill phentermine today.

## 2013-01-06 ENCOUNTER — Ambulatory Visit: Payer: Managed Care, Other (non HMO) | Admitting: Family Medicine

## 2013-01-18 ENCOUNTER — Ambulatory Visit (INDEPENDENT_AMBULATORY_CARE_PROVIDER_SITE_OTHER): Payer: Managed Care, Other (non HMO) | Admitting: Family Medicine

## 2013-01-18 ENCOUNTER — Encounter: Payer: Self-pay | Admitting: Family Medicine

## 2013-01-18 VITALS — BP 127/78 | HR 104 | Ht 64.0 in | Wt 237.0 lb

## 2013-01-18 DIAGNOSIS — E669 Obesity, unspecified: Secondary | ICD-10-CM

## 2013-01-18 DIAGNOSIS — R635 Abnormal weight gain: Secondary | ICD-10-CM

## 2013-01-18 MED ORDER — PHENTERMINE HCL 37.5 MG PO CAPS: 37.5000 mg | ORAL_CAPSULE | ORAL | Status: DC

## 2013-01-18 NOTE — Progress Notes (Signed)
  Subjective:    Patient ID: Janet Galloway, female    DOB: [birth year], 52 y.o.   MRN: [id number]  HPI F/U phentermine for weight loss. Tolerating well with no palpitaions, CP, SOB.  She has lost 6 more lbs.  No medication S.E.    Review of Systems     Objective:   Physical Exam        Assessment & Plan:  Abnormal weight gain - doing great. Down 6lbs. Feeling great. BP well controlled. F/U in 1 mo for BP /wt check.  Call if any concnerns. Make sure exercising.   Nani Gasser, MD

## 2013-05-17 ENCOUNTER — Encounter: Payer: Self-pay | Admitting: Family Medicine

## 2013-05-17 ENCOUNTER — Telehealth: Payer: Self-pay | Admitting: Family Medicine

## 2013-05-17 DIAGNOSIS — K760 Fatty (change of) liver, not elsewhere classified: Secondary | ICD-10-CM | POA: Insufficient documentation

## 2013-05-17 NOTE — Telephone Encounter (Signed)
Pt called and informed. She stated that she is going to look up this information on line and call back.Janet Galloway [place]

## 2013-05-17 NOTE — Telephone Encounter (Signed)
Please call patient and let her know because she does have fatty liver guidelines recommend that she have the hepatitis A vaccine. It is a two-part series vaccine. If she is interested then she can make a nurse visit to have this done or make an appointment with me if she would like to discuss it further.

## 2013-06-20 ENCOUNTER — Telehealth: Payer: Self-pay | Admitting: Family Medicine

## 2013-06-20 DIAGNOSIS — I10 Essential (primary) hypertension: Secondary | ICD-10-CM

## 2013-06-20 DIAGNOSIS — E785 Hyperlipidemia, unspecified: Secondary | ICD-10-CM

## 2013-06-20 NOTE — Telephone Encounter (Signed)
She really needs to go for labwork. I see was orderedin April and she never went.  Neesd CMP and lipids.

## 2013-06-21 NOTE — Addendum Note (Signed)
Addended by: Deno Etienne on: 06/21/2013 03:37 PM   Modules accepted: Orders

## 2013-06-21 NOTE — Telephone Encounter (Signed)
Left detailed message informing pt that she will need to go to the lab and have CMP and lipids done she will need to fast for 8 hours. She can ONLY have water, black coffee, or unsweetened tea NO SUGAR, CREAM OR ARTIFICIAL SWEETENERS.Loralee Pacas [place]

## 2013-08-04 ENCOUNTER — Other Ambulatory Visit: Payer: Self-pay | Admitting: Family Medicine

## 2013-08-09 ENCOUNTER — Telehealth: Payer: Self-pay | Admitting: *Deleted

## 2013-08-09 NOTE — Telephone Encounter (Signed)
lvm informing her that meds have been sent to pharmacy.Loralee Pacas [place]

## 2013-08-09 NOTE — Telephone Encounter (Signed)
Called with regards to her husbands meds stated that he has not received them they she gave me the mail orders phone and fax numbers. i will call to verify that rx have been filled and call her back.Loralee Pacas [place]  Ph.   [phone] Fax. [phone]

## 2013-11-20 ENCOUNTER — Other Ambulatory Visit: Payer: Self-pay | Admitting: Family Medicine

## 2013-11-23 HISTORY — PX: LAPAROSCOPIC GASTRIC SLEEVE RESECTION: SHX5895

## 2014-05-08 ENCOUNTER — Encounter: Payer: Managed Care, Other (non HMO) | Admitting: Family Medicine

## 2014-05-18 ENCOUNTER — Ambulatory Visit (INDEPENDENT_AMBULATORY_CARE_PROVIDER_SITE_OTHER): Payer: Managed Care, Other (non HMO) | Admitting: Family Medicine

## 2014-05-18 ENCOUNTER — Encounter: Payer: Self-pay | Admitting: Family Medicine

## 2014-05-18 ENCOUNTER — Telehealth: Payer: Self-pay | Admitting: Family Medicine

## 2014-05-18 VITALS — BP 131/77 | HR 83 | Ht 64.0 in | Wt 199.0 lb

## 2014-05-18 DIAGNOSIS — Z1211 Encounter for screening for malignant neoplasm of colon: Secondary | ICD-10-CM

## 2014-05-18 DIAGNOSIS — E785 Hyperlipidemia, unspecified: Secondary | ICD-10-CM

## 2014-05-18 DIAGNOSIS — Z0000 Encounter for general adult medical examination without abnormal findings: Secondary | ICD-10-CM

## 2014-05-18 DIAGNOSIS — L237 Allergic contact dermatitis due to plants, except food: Secondary | ICD-10-CM

## 2014-05-18 DIAGNOSIS — L255 Unspecified contact dermatitis due to plants, except food: Secondary | ICD-10-CM

## 2014-05-18 DIAGNOSIS — Z1231 Encounter for screening mammogram for malignant neoplasm of breast: Secondary | ICD-10-CM

## 2014-05-18 DIAGNOSIS — I10 Essential (primary) hypertension: Secondary | ICD-10-CM

## 2014-05-18 NOTE — Progress Notes (Signed)
Subjective:     Janet Galloway is a 53 y.o. female and is here for a comprehensive physical exam. The patient reports problems - She's been battling poison ivy on her arms and side abdomen and neck for a little over a week. She's been using some over-the-counter Caladryl lotion but is really not helping. His area on her right forearm that is weeping. She would like some relief with this. 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 She's doing fantastic with this and following with Dr. Seymour Bars at the bariatric clinic.Marland Kitchen  History   Social History  . Marital Status: Married    Spouse Name: N/A    Number of Children: N/A  . Years of Education: N/A   Occupational History  . Not on file.   Social History Main Topics  . Smoking status: Never Smoker   . Smokeless tobacco: Not on file  . Alcohol Use: No  . Drug Use: No  . Sexual Activity: Yes   Other Topics Concern  . Not on file   Social History Narrative  . No narrative on file   Health Maintenance  Topic Date Due  . Mammogram  11/07/2013  . Colonoscopy  11/07/2013  . Influenza Vaccine  06/23/2014  . Pap Smear  09/09/2015  . Tetanus/tdap  11/18/2015    The following portions of the patient's history were reviewed and updated as appropriate: allergies, current medications, past family history, past medical history, past social history, past surgical history and problem list.  Review of Systems A comprehensive review of systems was negative.   Objective:    BP 131/77  Pulse 83  Ht 5\' 4"  (1.626 m)  Wt 199 lb (90.266 kg)  BMI 34.14 kg/m2 General appearance: alert, cooperative and appears stated age Head: Normocephalic, without obvious abnormality, atraumatic Eyes: Conjunctiva clear, extra thin movements intact, pupils equal and reactive to light. Ears: normal TM's and external ear canals both ears Nose: Nares normal. Septum midline. Mucosa normal. No drainage or sinus  tenderness. Throat: lips, mucosa, and tongue normal; teeth and gums normal Neck: no adenopathy, no carotid bruit, no JVD, supple, symmetrical, trachea midline and thyroid not enlarged, symmetric, no tenderness/mass/nodules Back: symmetric, no curvature. ROM normal. No CVA tenderness. Lungs: clear to auscultation bilaterally Breasts: normal appearance, no masses or tenderness Heart: regular rate and rhythm, S1, S2 normal, no murmur, click, rub or gallop Abdomen: soft, non-tender; bowel sounds normal; no masses,  no organomegaly Extremities: extremities normal, atraumatic, no cyanosis or edema Pulses: 2+ and symmetric Skin: She has an erythematous raised rash on her right forearm, left forearm, neck, upper torso including over her right breast Lymph nodes: Cervical, supraclavicular, and axillary nodes normal. Neurologic: Alert and oriented X 3, normal strength and tone. Normal symmetric reflexes. Normal coordination and gait    Assessment:    Healthy female exam.      Plan:     See After Visit Summary for Counseling Recommendations  Keep up a regular exercise program and make sure you are eating a healthy diet Try to eat 4 servings of dairy a day, or if you are lactose intolerant take a calcium with vitamin D daily.  Your vaccines are up to date.   Discussed need for shingles vaccine. Handout provided. She will check with her insurance to see if it's covered.  Referral for mammogram place.  Discussed need for screening colonoscopy and she is now 53 years old. We'll place a referral.  Pamphlet provided..  Due for screening labwork.  status post bariatric surgery-she is now off of her blood pressure medication and her statin. I would like to recheck her lipids to make sure that they're still well controlled.   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 I want to avoid oral steroids because of her recent gastric bypass surgery.

## 2014-05-18 NOTE — Telephone Encounter (Signed)
Re: Referral for special screening for malignant neoplasms,colon I spoke with Misty Stanley at [facility].  Patient will need to have a family history of the above or something going on with her now for them to accept this referral.  Thank you.

## 2014-05-18 NOTE — Patient Instructions (Signed)
You should get a phone call from [facility] to schedule a screening colonoscopy. You should get a phone call from our imaging Department downstairs to schedule your mammogram. If your rash is not improving then please let me know.  Poison [employer] ivy is a inflammation of the skin (contact dermatitis) caused by touching the allergens on the leaves of the ivy plant following previous exposure to the plant. The rash usually appears 48 hours after exposure. The rash is usually bumps (papules) or blisters (vesicles) in a linear pattern. Depending on your own sensitivity, the rash may simply cause redness and itching, or it may also progress to blisters which may break open. These must be well cared for to prevent secondary bacterial (germ) infection, followed by scarring. Keep any open areas dry, clean, dressed, and covered with an antibacterial ointment if needed. The eyes may also get puffy. The puffiness is worst in the morning and gets better as the day progresses. This dermatitis usually heals without scarring, within 2 to 3 weeks without treatment. HOME CARE INSTRUCTIONS  Thoroughly wash with soap and water as soon as you have been exposed to poison ivy. You have about one half hour to remove the plant resin before it will cause the rash. This washing will destroy the oil or antigen on the skin that is causing, or will cause, the rash. Be sure to wash under your fingernails as any plant resin there will continue to spread the rash. Do not rub skin vigorously when washing affected area. Poison ivy cannot spread if no oil from the plant remains on your body. A rash that has progressed to weeping sores will not spread the rash unless you have not washed thoroughly. It is also important to wash any clothes you have been wearing as these may carry active allergens. The rash will return if you wear the unwashed clothing, even several days later. Avoidance of the plant in the future is the best measure.  Poison ivy plant can be recognized by the number of leaves. Generally, poison ivy has three leaves with flowering branches on a single stem. Diphenhydramine may be purchased over the counter and used as needed for itching. Do not drive with this medication if it makes you drowsy.Ask your caregiver about medication for children. SEEK MEDICAL CARE IF:  Open sores develop.  Redness spreads beyond area of rash.  You notice purulent (pus-like) discharge.  You have increased pain.  Other signs of infection develop (such as fever). Document Released: 11/06/2000 Document Revised: 02/01/2012 Document Reviewed: 09/25/2009 [facility] Patient Information 2015 [place], [place]. This information is not intended to replace advice given to you by your health care Dalayla Aldredge. Make sure you discuss any questions you have with your health care Dave Mannes.

## 2014-05-18 NOTE — Telephone Encounter (Signed)
Please call and let them know it is for a screening colonoscopy for age 53.

## 2014-05-18 NOTE — Telephone Encounter (Signed)
Note sent to Elease Hashimoto to call to verify.Loralee Pacas [place]

## 2014-05-21 NOTE — Telephone Encounter (Signed)
I called [facility] back they got the date of birth mixed up, birthdate they had  was for 53 year old.  Appt has been scheduled.

## 2014-06-04 DIAGNOSIS — K912 Postsurgical malabsorption, not elsewhere classified: Secondary | ICD-10-CM | POA: Insufficient documentation

## 2014-06-21 ENCOUNTER — Ambulatory Visit (INDEPENDENT_AMBULATORY_CARE_PROVIDER_SITE_OTHER): Payer: Managed Care, Other (non HMO)

## 2014-06-21 DIAGNOSIS — Z1231 Encounter for screening mammogram for malignant neoplasm of breast: Secondary | ICD-10-CM

## 2014-06-21 DIAGNOSIS — R928 Other abnormal and inconclusive findings on diagnostic imaging of breast: Secondary | ICD-10-CM

## 2014-06-25 ENCOUNTER — Other Ambulatory Visit: Payer: Self-pay | Admitting: Family Medicine

## 2014-06-25 DIAGNOSIS — R928 Other abnormal and inconclusive findings on diagnostic imaging of breast: Secondary | ICD-10-CM

## 2014-06-25 LAB — LIPID PANEL
Cholesterol: 188 mg/dL (ref 0–200)
HDL: 45 mg/dL (ref >39)
LDL CALC: 119 mg/dL — AB (ref 0–99)
Total CHOL/HDL Ratio: 4.2 Ratio
Triglycerides: 118 mg/dL (ref <150)
VLDL: 24 mg/dL (ref 0–40)

## 2014-06-25 LAB — COMPLETE METABOLIC PANEL WITH GFR
ALK PHOS: 85 U/L (ref 39–117)
ALT: 68 U/L — AB (ref 0–35)
AST: 60 U/L — AB (ref 0–37)
Albumin: 3.7 g/dL (ref 3.5–5.2)
BILIRUBIN TOTAL: 0.5 mg/dL (ref 0.2–1.2)
BUN: 10 mg/dL (ref 6–23)
CALCIUM: 9.7 mg/dL (ref 8.4–10.5)
CHLORIDE: 105 meq/L (ref 96–112)
CO2: 28 meq/L (ref 19–32)
CREATININE: 0.64 mg/dL (ref 0.50–1.10)
GFR, Est Non African American: >89 mL/min
GLUCOSE: 90 mg/dL (ref 70–99)
POTASSIUM: 3.9 meq/L (ref 3.5–5.3)
SODIUM: 139 meq/L (ref 135–145)
Total Protein: 6.6 g/dL (ref 6.0–8.3)

## 2014-06-26 ENCOUNTER — Other Ambulatory Visit: Payer: Self-pay | Admitting: *Deleted

## 2014-07-04 ENCOUNTER — Ambulatory Visit
Admission: RE | Admit: 2014-07-04 | Discharge: 2014-07-04 | Disposition: A | Discharge status: Home / Self Care | Payer: Private Health Insurance - Indemnity | Source: Ambulatory Visit | Attending: Family Medicine | Admitting: Family Medicine

## 2014-07-04 ENCOUNTER — Encounter (INDEPENDENT_AMBULATORY_CARE_PROVIDER_SITE_OTHER): Payer: Self-pay

## 2014-07-04 DIAGNOSIS — R928 Other abnormal and inconclusive findings on diagnostic imaging of breast: Secondary | ICD-10-CM

## 2015-06-21 ENCOUNTER — Ambulatory Visit (INDEPENDENT_AMBULATORY_CARE_PROVIDER_SITE_OTHER): Payer: Private Health Insurance - Indemnity | Admitting: Family Medicine

## 2015-06-21 ENCOUNTER — Encounter: Payer: Self-pay | Admitting: Family Medicine

## 2015-06-21 ENCOUNTER — Ambulatory Visit (INDEPENDENT_AMBULATORY_CARE_PROVIDER_SITE_OTHER): Payer: Managed Care, Other (non HMO)

## 2015-06-21 VITALS — BP 150/89 | HR 79 | Wt 196.0 lb

## 2015-06-21 DIAGNOSIS — M25561 Pain in right knee: Secondary | ICD-10-CM | POA: Diagnosis not present

## 2015-06-21 MED ORDER — NAPROXEN 500 MG PO TABS: 500.0000 mg | ORAL_TABLET | Freq: Two times a day (BID) | ORAL | Status: DC

## 2015-06-21 NOTE — Patient Instructions (Signed)
Thank you for coming in today. Take naproxen for pain.  Return in a few weeks if not better.   Patellofemoral Syndrome If you have had pain in the front of your knee for a long time, chances are good that you have patellofemoral syndrome. The word patella refers to the kneecap. Femoral (or femur) refers to the thigh bone. That is the bone the kneecap sits on. The kneecap is shaped like a triangle. Its job is to protect the knee and to improve the efficiency of your thigh muscles (quadriceps). The underside of the kneecap is made of smooth tissue (cartilage). This lets the kneecap slide up and down as the knee moves. Sometimes this cartilage becomes soft. Your healthcare provider may say the cartilage breaks down. That is patellofemoral syndrome. It can affect one knee, or both. The condition is sometimes called patellofemoral pain syndrome. That is because the condition is painful. The pain usually gets worse with activity. Sitting for a long time with the knee bent also makes the pain worse. It usually gets better with rest and proper treatment. CAUSES  No one is sure why some people develop this problem and others do not. Runners often get it. One name for the condition is "runner's knee." However, some people run for years and never have knee pain. Certain things seem to make patellofemoral syndrome more likely. They include:  Moving out of alignment. The kneecap is supposed to move in a straight line when the thigh muscle pulls on it. Sometimes the kneecap moves in poor alignment. That can make the knee swell and hurt. Some experts believe it also wears down the cartilage.  Injury to the kneecap.  Strain on the knee. This may occur during sports activity. Soccer, running, skiing and cycling can put excess stress on the knee.  Being flat-footed or knock-kneed. SYMPTOMS   Knee pain.  Pain under the kneecap. This is usually a dull, aching pain.  Pain in the knee when doing certain things:  squatting, kneeling, going up or down stairs.  Pain in the knee when you stand up after sitting down for awhile.  Tightness in the knee.  Loss of muscle strength in the thigh.  Swelling of the knee. DIAGNOSIS  Healthcare providers often send people with knee pain to an orthopedic caregiver. This person has special training to treat problems with bones and joints. To decide what is causing your knee pain, your caregiver will probably:  Do a physical exam. This will probably include:  Asking about symptoms you have noticed.  Asking about your activities and any injuries.  Feeling your knee. Moving it. This will help test the knee's strength. It will also check alignment (whether the knee and leg are aligned normally).  Order some tests, such as:  Imaging tests. They create pictures of the inside of the knee. Tests may include:  X-rays.  Computed tomography (CT) scan. This uses X-rays and a computer to show more detail.  Magnetic resonance imaging (MRI). This test uses magnets, radio waves and a computer to make pictures. TREATMENT   Medication is almost always used first. It can relieve pain. It also can reduce swelling. Non-steroidal anti-inflammatory medicines (called NSAIDs) are usually suggested. Sometimes a stronger form is needed. A stronger form would require a prescription.  Other treatment may be needed after the swelling goes down. Possibilities include:  Exercise. Certain exercises can make the muscles around the knee stronger which decreases the pressure on the knee cap. This includes the thigh  muscle. Certain exercises also may be suggested to increase your flexibility.  A knee brace. This gives the knee extra support and helps align the movement of the knee cap.  Orthotics. These are special shoe inserts. They can help keep your leg and knee aligned.  Surgery is sometimes needed. This is rare. Options include:  Arthroscopy. The surgeon uses a special tool to  remove any damaged pieces of the kneecap. Only a few small incisions (cuts) are needed.  Realignment. This is open surgery. The goals are to reduce pressure and fix the way the kneecap moves. HOME CARE INSTRUCTIONS   Take any medication prescribed by your healthcare provider. Follow the directions carefully.  If your knee is swollen:  Put ice or cold packs on it. Do this for 20 to 30 minutes, 3 to 4 times a day.  Keep the knee raised. Make sure it is supported. Put a pillow under it.  Rest your knee. For example, take the elevator instead of the stairs for awhile. Or, take a break from sports activity that strain your knee. Try walking or swimming instead.  Whenever you are active:  Use an elastic bandage on your knee. This gives it support.  After any activity, put ice or cold packs on your knees. Do this for about 10 to 20 minutes.  Make sure you wear shoes that give good support. Make sure they are not worn down. The heels should not slant in or out. SEEK MEDICAL CARE IF:   Knee pain gets worse. Or it does not go away, even after taking pain medicine.  Swelling does not go down.  Your thigh muscle becomes weak.  You have an oral temperature above 102 F (38.9 C). SEEK IMMEDIATE MEDICAL CARE IF:  You have an oral temperature above 102 F (38.9 C), not controlled by medicine. Document Released: 10/28/2009 Document Revised: 02/01/2012 Document Reviewed: 01/29/2014 [facility] Patient Information 2015 [place], [place]. This information is not intended to replace advice given to you by your health care provider. Make sure you discuss any questions you have with your health care provider.

## 2015-06-21 NOTE — Progress Notes (Signed)
Janet Galloway is a 54 y.o. female who presents to [facility]  today for right knee pain. Patient tripped and fell going up some stairs about a week ago landing on her anterior right knee. She notes anterior knee pain and some knee swelling. Pain is worse when she rises from a seated position and when she has her legs flexed for an extended period of time. She denies any locking catching or giving way. She has tried Tylenol Epsom salts hot and cold compression which a volatile and a little. She denies any radiating pain weakness or numbness fevers or chills. She works doing a Office manager.   Past Medical History  Diagnosis Date  . Hyperlipidemia   . Arthritis    Past Surgical History  Procedure Laterality Date  . 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 Smoking status: Never Smoker   . Smokeless tobacco: Not on file  . Alcohol Use: No   ROS as above Medications: Current Outpatient Prescriptions  Medication Sig Dispense Refill  . Methotrexate Sodium (METHOTREXATE PO) Take 8 tablets by mouth once a week.     . naproxen (NAPROSYN) 500 MG tablet Take 1 tablet (500 mg total) by mouth 2 (two) times daily with a meal. 30 tablet 0   No current facility-administered medications for this visit.   Allergies  Allergen Reactions  . Acetaminophen Itching    Had abnormal LFTs in past with using tylenol in large amounts for RA     Exam:  BP 150/89 mmHg  Pulse 79  Wt 196 lb (88.905 kg) Gen: Well NAD HEENT: EOMI,  MMM Lungs: Normal work of breathing. CTABL Heart: RRR no MRG Abd: NABS, Soft. Nondistended, Nontender Exts: Brisk capillary refill, warm and well perfused.  Right knee mildly swollen no ecchymosis. Tender to palpation anterior aspect of the knee. Range of motion 0-90 with 1+ retropatellar crepitations. Stable ligamentous exam. Negative McMurray's testing. Intact strength extension and  flexion Capillary refill and sensation is intact distal bilateral lower extremities.  No results found for this or any previous visit (from the past 24 hour(s)). 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 Degenerative changes are seen in all 3 joint compartments. No acute fracture or dislocation is noted.  IMPRESSION: Degenerative changes without acute abnormality.   Electronically Signed   By: Alcide Clever M.D.   On: 06/21/2015 10:06     Please see individual assessment and plan sections.

## 2015-06-21 NOTE — Progress Notes (Signed)
Quick Note:  Xray shows some arthritis. No fracture. ______

## 2015-06-21 NOTE — Assessment & Plan Note (Signed)
Likely contusion versus patellofemoral pain. X-ray was normal. Treat conservatively with NSAIDs and rest. If not better in a few weeks will consider ultrasound guided cortical steroid injection.

## 2016-01-13 IMAGING — CR DG KNEE COMPLETE 4+V*R*
4 series · 4 of 4 positions shown · non-contrast
Comparison: 09/11/2009

CLINICAL DATA: Anterior knee pain following fall 1 week ago,
initial encounter

EXAM:
RIGHT KNEE - COMPLETE 4+ VIEW

[knee ap]
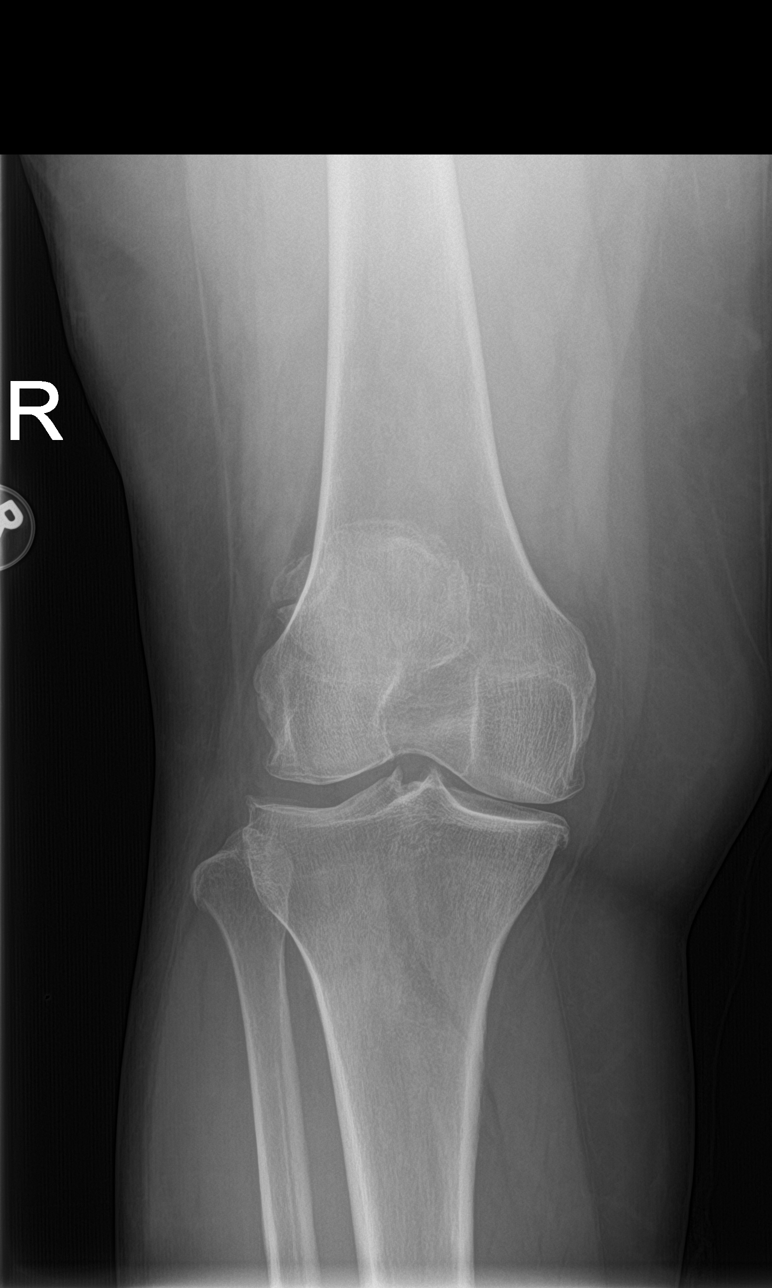

[tunnel]
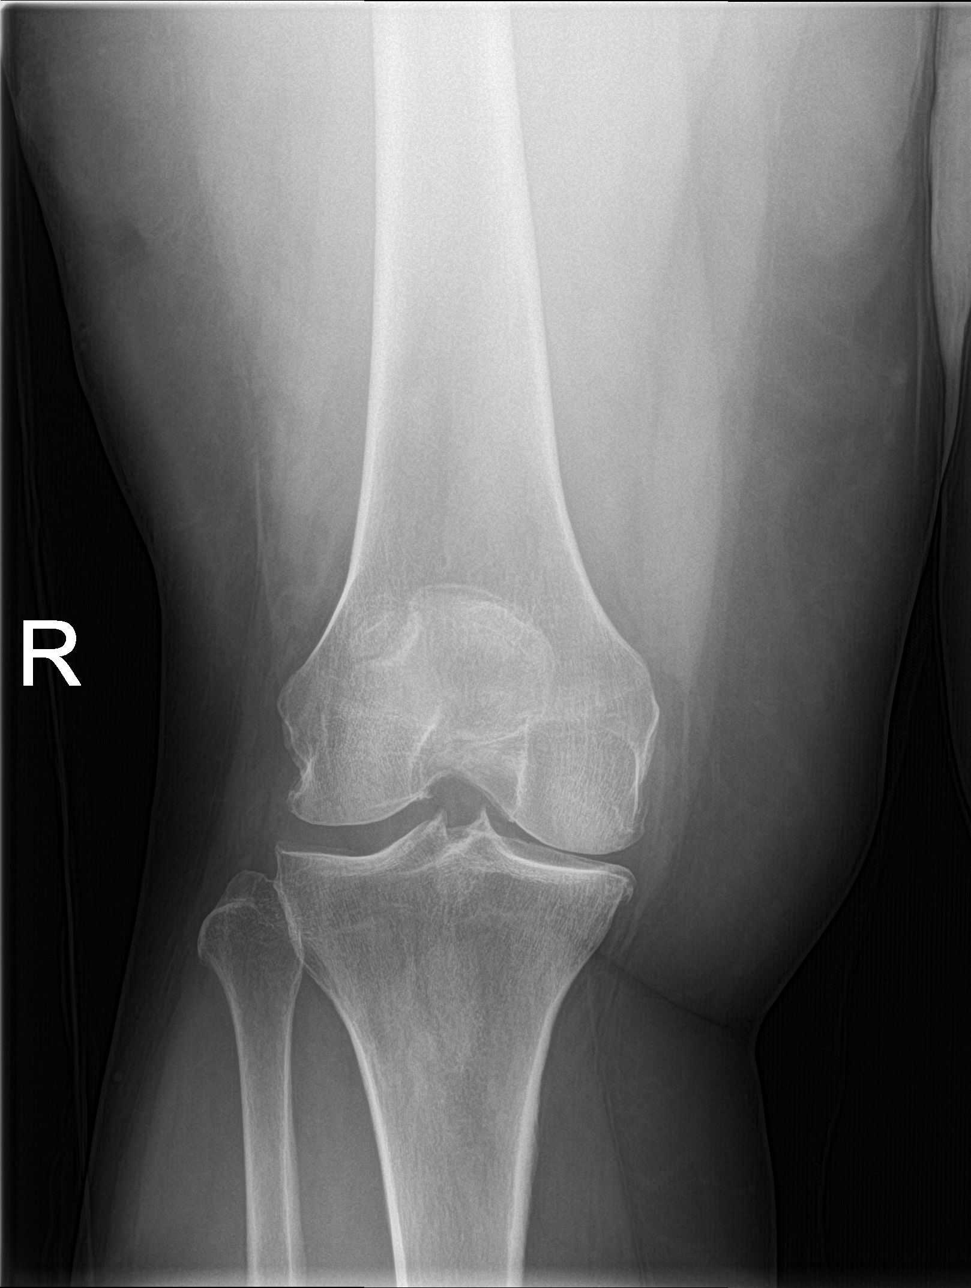

[knee lat]
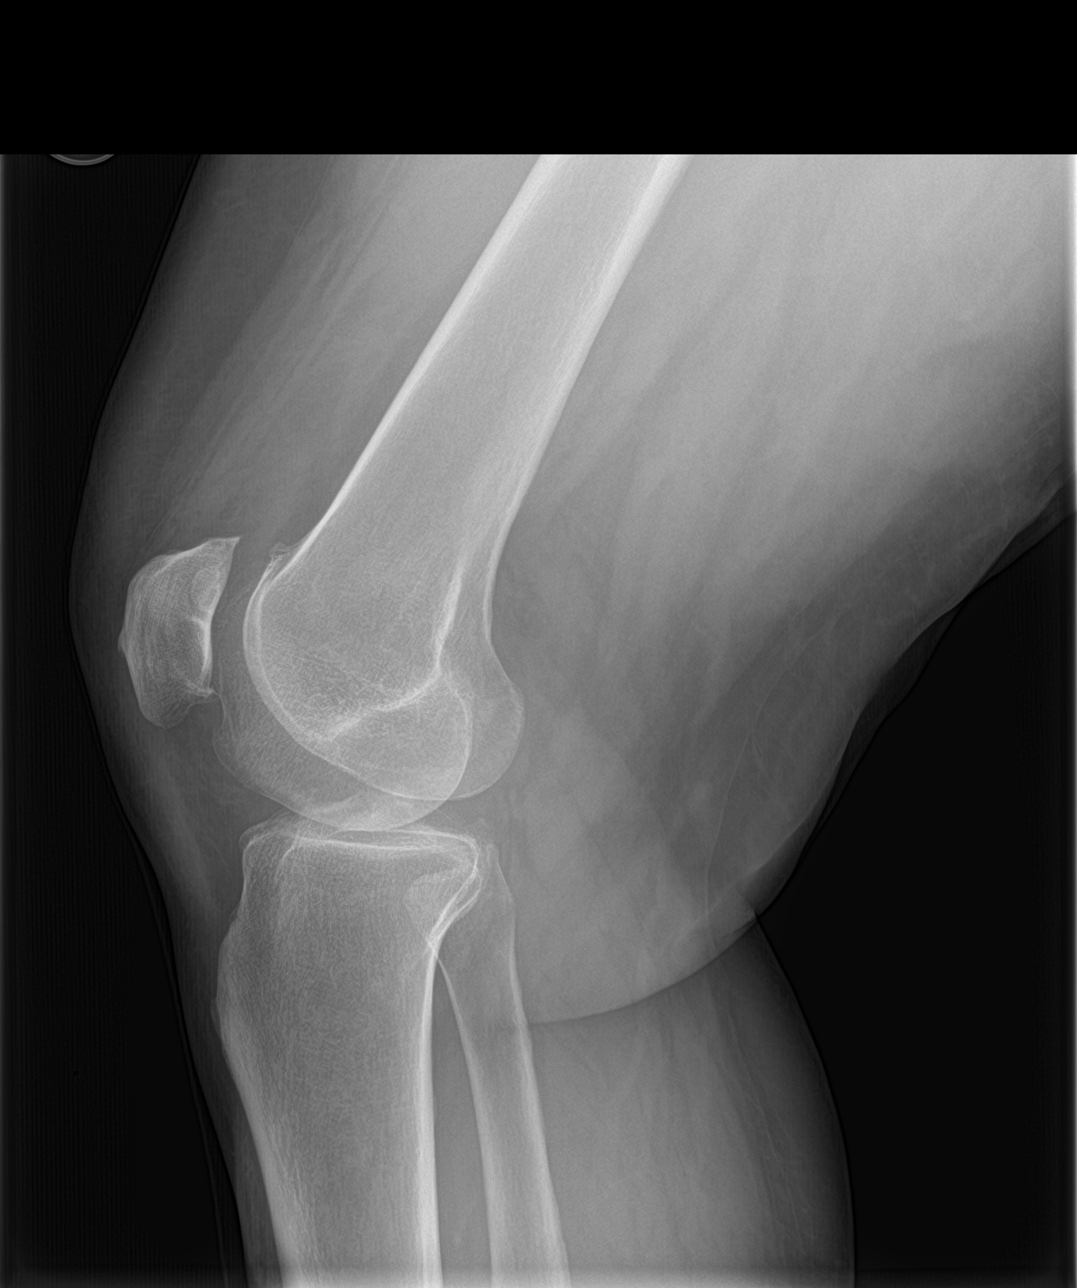

[knee sunrise]
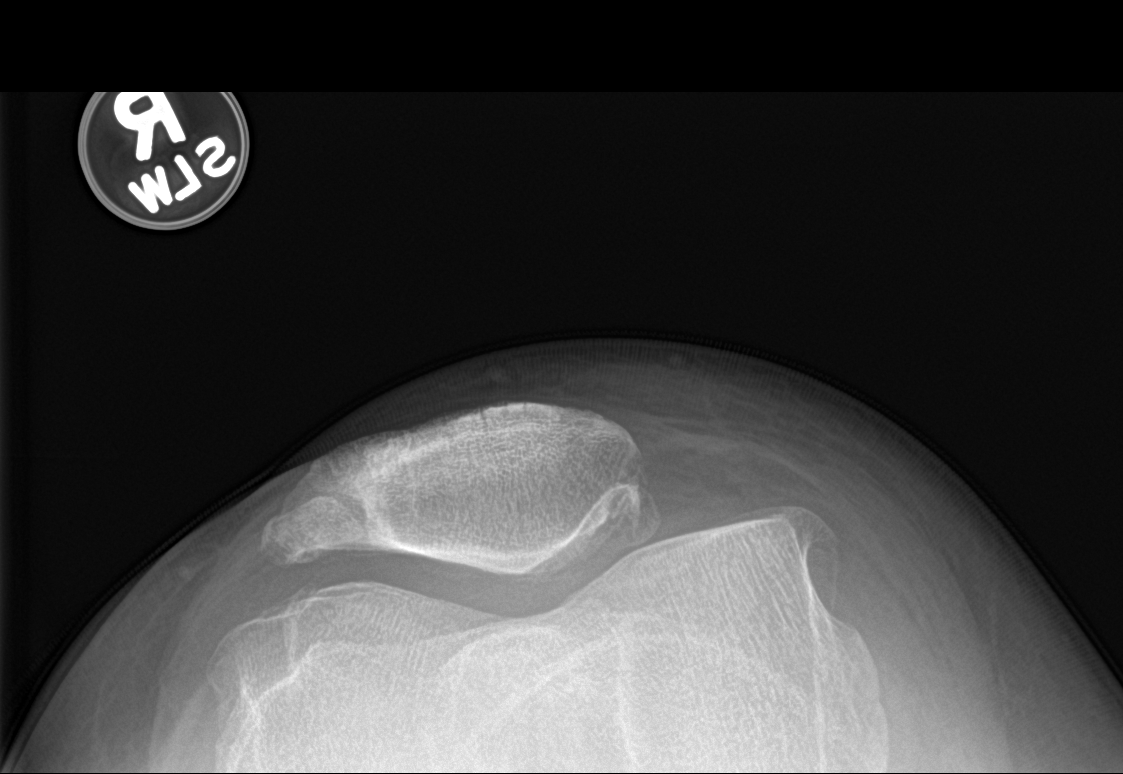

[4 of 4 positions shown; findings below may reference images not displayed]

FINDINGS: There are changes consistent with a bipartite patella. Degenerative
changes are seen in all 3 joint compartments. No acute fracture or
dislocation is noted.
IMPRESSION: Degenerative changes without acute abnormality.

## 2016-07-06 ENCOUNTER — Ambulatory Visit (INDEPENDENT_AMBULATORY_CARE_PROVIDER_SITE_OTHER): Payer: Managed Care, Other (non HMO) | Admitting: Family Medicine

## 2016-07-06 ENCOUNTER — Encounter: Payer: Self-pay | Admitting: Family Medicine

## 2016-07-06 ENCOUNTER — Other Ambulatory Visit (HOSPITAL_COMMUNITY)
Admission: RE | Admit: 2016-07-06 | Discharge: 2016-07-06 | Disposition: A | Discharge status: Home / Self Care | Payer: Managed Care, Other (non HMO) | Source: Ambulatory Visit | Attending: Family Medicine | Admitting: Family Medicine

## 2016-07-06 VITALS — BP 127/63 | HR 66 | Resp 15 | Ht 66.0 in | Wt 204.0 lb

## 2016-07-06 DIAGNOSIS — Z01419 Encounter for gynecological examination (general) (routine) without abnormal findings: Secondary | ICD-10-CM | POA: Diagnosis present

## 2016-07-06 DIAGNOSIS — R7301 Impaired fasting glucose: Secondary | ICD-10-CM | POA: Diagnosis not present

## 2016-07-06 DIAGNOSIS — Z114 Encounter for screening for human immunodeficiency virus [HIV]: Secondary | ICD-10-CM

## 2016-07-06 DIAGNOSIS — Z0189 Encounter for other specified special examinations: Secondary | ICD-10-CM

## 2016-07-06 DIAGNOSIS — Z0000 Encounter for general adult medical examination without abnormal findings: Secondary | ICD-10-CM

## 2016-07-06 DIAGNOSIS — Z1151 Encounter for screening for human papillomavirus (HPV): Secondary | ICD-10-CM | POA: Insufficient documentation

## 2016-07-06 DIAGNOSIS — Z1159 Encounter for screening for other viral diseases: Secondary | ICD-10-CM | POA: Diagnosis not present

## 2016-07-06 DIAGNOSIS — R928 Other abnormal and inconclusive findings on diagnostic imaging of breast: Secondary | ICD-10-CM

## 2016-07-06 LAB — COMPLETE METABOLIC PANEL WITH GFR
ALT: 15 U/L (ref 6–29)
AST: 17 U/L (ref 10–35)
Albumin: 3.8 g/dL (ref 3.6–5.1)
Alkaline Phosphatase: 124 U/L (ref 33–130)
BUN: 12 mg/dL (ref 7–25)
CHLORIDE: 106 mmol/L (ref 98–110)
CO2: 24 mmol/L (ref 20–31)
Calcium: 8.9 mg/dL (ref 8.6–10.4)
Creat: 0.64 mg/dL (ref 0.50–1.05)
Glucose, Bld: 83 mg/dL (ref 65–99)
Potassium: 4.2 mmol/L (ref 3.5–5.3)
Sodium: 140 mmol/L (ref 135–146)
Total Bilirubin: 0.2 mg/dL (ref 0.2–1.2)
Total Protein: 7.1 g/dL (ref 6.1–8.1)

## 2016-07-06 LAB — LIPID PANEL
CHOLESTEROL: 198 mg/dL (ref 125–200)
HDL: 58 mg/dL (ref >=46)
LDL Cholesterol: 114 mg/dL (ref <130)
TRIGLYCERIDES: 128 mg/dL (ref <150)
Total CHOL/HDL Ratio: 3.4 Ratio (ref <=5.0)
VLDL: 26 mg/dL (ref <30)

## 2016-07-06 NOTE — Patient Instructions (Addendum)
Keep up a regular exercise program and make sure you are eating a healthy diet Try to eat 4 servings of dairy a day, or if you are lactose intolerant take a calcium with vitamin D daily.     Menopause is a normal process in which your reproductive ability comes to an end. This process happens gradually over a span of months to years, usually between the ages of 49 and 55. Menopause is complete when you have missed 12 consecutive menstrual periods. It is important to talk with your health care provider about some of the most common conditions that affect postmenopausal women, such as heart disease, cancer, and bone loss (osteoporosis). Adopting a healthy lifestyle and getting preventive care can help to promote your health and wellness. Those actions can also lower your chances of developing some of these common conditions. WHAT SHOULD I KNOW ABOUT MENOPAUSE? During menopause, you may experience a number of symptoms, such as:  Moderate-to-severe hot flashes.  Night sweats.  Decrease in sex drive.  Mood swings.  Headaches.  Tiredness.  Irritability.  Memory problems.  Insomnia. Choosing to treat or not to treat menopausal changes is an individual decision that you make with your health care provider. WHAT SHOULD I KNOW ABOUT HORMONE REPLACEMENT THERAPY AND SUPPLEMENTS? Hormone therapy products are effective for treating symptoms that are associated with menopause, such as hot flashes and night sweats. Hormone replacement carries certain risks, especially as you become older. If you are thinking about using estrogen or estrogen with progestin treatments, discuss the benefits and risks with your health care provider. WHAT SHOULD I KNOW ABOUT HEART DISEASE AND STROKE? Heart disease, heart attack, and stroke become more likely as you age. This may be due, in part, to the hormonal changes that your body experiences during menopause. These can affect how your body processes dietary fats,  triglycerides, and cholesterol. Heart attack and stroke are both medical emergencies. There are many things that you can do to help prevent heart disease and stroke:  Have your blood pressure checked at least every 1-2 years. High blood pressure causes heart disease and increases the risk of stroke.  If you are 53-8 years old, ask your health care provider if you should take aspirin to prevent a heart attack or a stroke.  Do not use any tobacco products, including cigarettes, chewing tobacco, or electronic cigarettes. If you need help quitting, ask your health care provider.  It is important to eat a healthy diet and maintain a healthy weight.  Be sure to include plenty of vegetables, fruits, low-fat dairy products, and lean protein.  Avoid eating foods that are high in solid fats, added sugars, or salt (sodium).  Get regular exercise. This is one of the most important things that you can do for your health.  Try to exercise for at least 150 minutes each week. The type of exercise that you do should increase your heart rate and make you sweat. This is known as moderate-intensity exercise.  Try to do strengthening exercises at least twice each week. Do these in addition to the moderate-intensity exercise.  Know your numbers.Ask your health care provider to check your cholesterol and your blood glucose. Continue to have your blood tested as directed by your health care provider. WHAT SHOULD I KNOW ABOUT CANCER SCREENING? There are several types of cancer. Take the following steps to reduce your risk and to catch any cancer development as early as possible. Breast Cancer  Practice breast self-awareness.  This means  understanding how your breasts normally appear and feel.  It also means doing regular breast self-exams. Let your health care provider know about any changes, no matter how small.  If you are 33 or older, have a clinician do a breast exam (clinical breast exam or CBE) every  year. Depending on your age, family history, and medical history, it may be recommended that you also have a yearly breast X-ray (mammogram).  If you have a family history of breast cancer, talk with your health care provider about genetic screening.  If you are at high risk for breast cancer, talk with your health care provider about having an MRI and a mammogram every year.  Breast cancer (BRCA) gene test is recommended for women who have family members with BRCA-related cancers. Results of the assessment will determine the need for genetic counseling and BRCA1 and for BRCA2 testing. BRCA-related cancers include these types:  Breast. This occurs in males or females.  Ovarian.  Tubal. This may also be called fallopian tube cancer.  Cancer of the abdominal or pelvic lining (peritoneal cancer).  Prostate.  Pancreatic. Cervical, Uterine, and Ovarian Cancer Your health care provider may recommend that you be screened regularly for cancer of the pelvic organs. These include your ovaries, uterus, and vagina. This screening involves a pelvic exam, which includes checking for microscopic changes to the surface of your cervix (Pap test).  For women ages 21-65, health care providers may recommend a pelvic exam and a Pap test every three years. For women ages 72-65, they may recommend the Pap test and pelvic exam, combined with testing for human papilloma virus (HPV), every five years. Some types of HPV increase your risk of cervical cancer. Testing for HPV may also be done on women of any age who have unclear Pap test results.  Other health care providers may not recommend any screening for nonpregnant women who are considered low risk for pelvic cancer and have no symptoms. Ask your health care provider if a screening pelvic exam is right for you.  If you have had past treatment for cervical cancer or a condition that could lead to cancer, you need Pap tests and screening for cancer for at least 20  years after your treatment. If Pap tests have been discontinued for you, your risk factors (such as having a new sexual partner) need to be reassessed to determine if you should start having screenings again. Some women have medical problems that increase the chance of getting cervical cancer. In these cases, your health care provider may recommend that you have screening and Pap tests more often.  If you have a family history of uterine cancer or ovarian cancer, talk with your health care provider about genetic screening.  If you have vaginal bleeding after reaching menopause, tell your health care provider.  There are currently no reliable tests available to screen for ovarian cancer. Lung Cancer Lung cancer screening is recommended for adults 98-50 years old who are at high risk for lung cancer because of a history of smoking. A yearly low-dose CT scan of the lungs is recommended if you:  Currently smoke.  Have a history of at least 30 pack-years of smoking and you currently smoke or have quit within the past 15 years. A pack-year is smoking an average of one pack of cigarettes per day for one year. Yearly screening should:  Continue until it has been 15 years since you quit.  Stop if you develop a health problem that would  prevent you from having lung cancer treatment. Colorectal Cancer  This type of cancer can be detected and can often be prevented.  Routine colorectal cancer screening usually begins at age 75 and continues through age 12.  If you have risk factors for colon cancer, your health care provider may recommend that you be screened at an earlier age.  If you have a family history of colorectal cancer, talk with your health care provider about genetic screening.  Your health care provider may also recommend using home test kits to check for hidden blood in your stool.  A small camera at the end of a tube can be used to examine your colon directly (sigmoidoscopy or  colonoscopy). This is done to check for the earliest forms of colorectal cancer.  Direct examination of the colon should be repeated every 5-10 years until age 64. However, if early forms of precancerous polyps or small growths are found or if you have a family history or genetic risk for colorectal cancer, you may need to be screened more often. Skin Cancer  Check your skin from head to toe regularly.  Monitor any moles. Be sure to tell your health care provider:  About any new moles or changes in moles, especially if there is a change in a mole's shape or color.  If you have a mole that is larger than the size of a pencil eraser.  If any of your family members has a history of skin cancer, especially at a young age, talk with your health care provider about genetic screening.  Always use sunscreen. Apply sunscreen liberally and repeatedly throughout the day.  Whenever you are outside, protect yourself by wearing long sleeves, pants, a wide-brimmed hat, and sunglasses. WHAT SHOULD I KNOW ABOUT OSTEOPOROSIS? Osteoporosis is a condition in which bone destruction happens more quickly than new bone creation. After menopause, you may be at an increased risk for osteoporosis. To help prevent osteoporosis or the bone fractures that can happen because of osteoporosis, the following is recommended:  If you are 52-60 years old, get at least 1,000 mg of calcium and at least 600 mg of vitamin D per day.  If you are older than age 48 but younger than age 9, get at least 1,200 mg of calcium and at least 600 mg of vitamin D per day.  If you are older than age 50, get at least 1,200 mg of calcium and at least 800 mg of vitamin D per day. Smoking and excessive alcohol intake increase the risk of osteoporosis. Eat foods that are rich in calcium and vitamin D, and do weight-bearing exercises several times each week as directed by your health care provider. WHAT SHOULD I KNOW ABOUT HOW MENOPAUSE AFFECTS MY  MENTAL HEALTH? Depression may occur at any age, but it is more common as you become older. Common symptoms of depression include:  Low or sad mood.  Changes in sleep patterns.  Changes in appetite or eating patterns.  Feeling an overall lack of motivation or enjoyment of activities that you previously enjoyed.  Frequent crying spells. Talk with your health care provider if you think that you are experiencing depression. WHAT SHOULD I KNOW ABOUT IMMUNIZATIONS? It is important that you get and maintain your immunizations. These include:  Tetanus, diphtheria, and pertussis (Tdap) booster vaccine.  Influenza every year before the flu season begins.  Pneumonia vaccine.  Shingles vaccine. Your health care provider may also recommend other immunizations.   This information is not intended  to replace advice given to you by your health care provider. Make sure you discuss any questions you have with your health care provider.   Document Released: 01/01/2006 Document Revised: 11/30/2014 Document Reviewed: 07/12/2014 Elsevier Interactive Patient Education [employer].

## 2016-07-06 NOTE — Progress Notes (Signed)
   Subjective:     Ryian L Mcmillion is a 55 y.o. female and is here for a comprehensive physical exam. The patient reports no problems.  Social History   Social History  . Marital status: Married    Spouse name: N/A  . Number of children: N/A  . Years of education: N/A   Occupational History  . Not on file.   Social History Main Topics  . Smoking status: Never Smoker  . Smokeless tobacco: Not on file  . Alcohol use No  . Drug use: No  . Sexual activity: Yes   Other Topics Concern  . Not on file   Social History Narrative  . No narrative on file   Health Maintenance  Topic Date Due  . Hepatitis C Screening  [birth year]  . HIV Screening  11/07/1978  . PAP SMEAR  09/09/2015  . MAMMOGRAM  07/04/2016  . INFLUENZA VACCINE  06/23/2016  . TETANUS/TDAP  11/22/2017 (Originally 11/18/2015)  . COLONOSCOPY  06/27/2019    The following portions of the patient's history were reviewed and updated as appropriate: allergies, current medications, past family history, past medical history, past social history, past surgical history and problem list.  Review of Systems A comprehensive review of systems was negative.   Objective:    BP 127/63 (BP Location: Left Arm, Patient Position: Sitting, Cuff Size: Normal)   Pulse 66   Resp 15   Ht 5\' 6"  (1.676 m)   Wt 204 lb (92.5 kg)   SpO2 100%   BMI 32.93 kg/m  General appearance: alert, cooperative and appears stated age Head: Normocephalic, without obvious abnormality, atraumatic Eyes: conj clear, EOMI, PEERLA, wears glasses Ears: normal TM's and external ear canals both ears Nose: Nares normal. Septum midline. Mucosa normal. No drainage or sinus tenderness. Throat: lips, mucosa, and tongue normal; teeth and gums normal Neck: no adenopathy, no carotid bruit, no JVD, supple, symmetrical, trachea midline and thyroid not enlarged, symmetric, no tenderness/mass/nodules Back: symmetric, no curvature. ROM normal. No CVA  tenderness. Lungs: clear to auscultation bilaterally Breasts: normal appearance, no masses or tenderness Heart: regular rate and rhythm, S1, S2 normal, no murmur, click, rub or gallop Abdomen: soft, non-tender; bowel sounds normal; no masses,  no organomegaly Pelvic: cervix normal in appearance, external genitalia normal, no adnexal masses or tenderness, no cervical motion tenderness, rectovaginal septum normal, uterus normal size, shape, and consistency and vagina normal without discharge Extremities: extremities normal, atraumatic, no cyanosis or edema Pulses: 2+ and symmetric Skin: Skin color, texture, turgor normal. No rashes or lesions Lymph nodes: Cervical, supraclavicular, and axillary nodes normal. Neurologic: Alert and oriented X 3, normal strength and tone. Normal symmetric reflexes. Normal coordination and gait    Assessment:    Healthy female exam.     Plan:     See After Visit Summary for Counseling Recommendations   Keep up a regular exercise program and make sure you are eating a healthy diet Try to eat 4 servings of dairy a day, or if you are lactose intolerant take a calcium with vitamin D daily.  Declined Tdap today.  Has been performed today. Will call for results once available. HM h.o. Given.

## 2016-07-07 ENCOUNTER — Telehealth: Payer: Self-pay | Admitting: *Deleted

## 2016-07-07 LAB — HEPATITIS C ANTIBODY: HCV Ab: NEGATIVE

## 2016-07-07 LAB — HEMOGLOBIN A1C
Hgb A1c MFr Bld: 5.7 % — ABNORMAL HIGH (ref <5.7)
MEAN PLASMA GLUCOSE: 117 mg/dL

## 2016-07-07 LAB — HIV ANTIBODY (ROUTINE TESTING W REFLEX): HIV: NONREACTIVE

## 2016-07-07 NOTE — Telephone Encounter (Signed)
Patient advised of labs results, given and discussed. Encouraged healthy diet and exercise.

## 2016-07-07 NOTE — Telephone Encounter (Signed)
-----   Message from Agapito Games, MD sent at 07/07/2016  7:43 AM EDT ----- Call patient: Negative for hepatitis C. Negative for HIV. Hemoglobin A1c is 5.7 which is consistent with prediabetes it's just borderline between normal and prediabetes. We will continue to monitor this yearly. Continue work on [employer] and regular exercise. Liver enzymes are completely normal. Kidney function normal. Cholesterol is okay.

## 2016-07-09 LAB — CYTOLOGY - PAP

## 2016-07-09 NOTE — Progress Notes (Signed)
Call patient: Your Pap smear is normal. Repeat in 5 years.

## 2019-02-21 ENCOUNTER — Encounter: Payer: Managed Care, Other (non HMO) | Admitting: Family Medicine

## 2019-04-04 ENCOUNTER — Encounter: Payer: Managed Care, Other (non HMO) | Admitting: Family Medicine

## 2019-05-18 ENCOUNTER — Encounter: Payer: Managed Care, Other (non HMO) | Admitting: Family Medicine

## 2019-06-22 ENCOUNTER — Encounter: Payer: 59 | Admitting: Family Medicine

## 2020-02-22 ENCOUNTER — Other Ambulatory Visit (HOSPITAL_COMMUNITY)
Admission: RE | Admit: 2020-02-22 | Discharge: 2020-02-22 | Disposition: A | Discharge status: Home / Self Care | Payer: 59 | Source: Ambulatory Visit | Attending: Family Medicine | Admitting: Family Medicine

## 2020-02-22 ENCOUNTER — Encounter: Payer: Self-pay | Admitting: Family Medicine

## 2020-02-22 ENCOUNTER — Ambulatory Visit (INDEPENDENT_AMBULATORY_CARE_PROVIDER_SITE_OTHER): Payer: 59 | Admitting: Family Medicine

## 2020-02-22 ENCOUNTER — Other Ambulatory Visit: Payer: Self-pay

## 2020-02-22 VITALS — BP 122/65 | HR 73 | Temp 98.5°F | Ht 66.14 in | Wt 230.0 lb

## 2020-02-22 DIAGNOSIS — Z23 Encounter for immunization: Secondary | ICD-10-CM | POA: Diagnosis not present

## 2020-02-22 DIAGNOSIS — Z124 Encounter for screening for malignant neoplasm of cervix: Secondary | ICD-10-CM | POA: Diagnosis present

## 2020-02-22 DIAGNOSIS — Z1231 Encounter for screening mammogram for malignant neoplasm of breast: Secondary | ICD-10-CM | POA: Diagnosis not present

## 2020-02-22 DIAGNOSIS — Z0000 Encounter for general adult medical examination without abnormal findings: Secondary | ICD-10-CM | POA: Diagnosis not present

## 2020-02-22 NOTE — Progress Notes (Signed)
Subjective:     Janet Galloway is a 59 y.o. female and is here for a comprehensive physical exam. The patient reports no problems. She is doing well. No regular exercise.  No pelvic or breast sxs.   New Pt CPE.  Last seen about 4 years ago in our office she is actually doing well overall.  He does have a diagnosis of rheumatoid arthritis and has been following with Dr. Basilia Jumbo..  She says that she has had blood pressures that have been elevated but has been very resistant to start medication but more recently cut back significantly on her salt intake and has been following the DASH diet.  Social History   Socioeconomic History  . Marital status: Married    Spouse name: Not on file  . Number of children: Not on file  . Years of education: Not on file  . Highest education level: Not on file  Occupational History  . Not on file  Tobacco Use  . Smoking status: Never Smoker  . Smokeless tobacco: Never Used  Substance and Sexual Activity  . Alcohol use: No  . Drug use: No  . Sexual activity: Yes  Other Topics Concern  . Not on file  Social History Narrative  . Not on file   Social Determinants of Health   Financial Resource Strain:   . Difficulty of Paying Living Expenses:   Food Insecurity:   . Worried About Programme researcher, broadcasting/film/video in the Last Year:   . Barista in the Last Year:   Transportation Needs:   . Freight forwarder (Medical):   Marland Kitchen Lack of Transportation (Non-Medical):   Physical Activity:   . Days of Exercise per Week:   . Minutes of Exercise per Session:   Stress:   . Feeling of Stress :   Social Connections:   . Frequency of Communication with Friends and Family:   . Frequency of Social Gatherings with Friends and Family:   . Attends Religious Services:   . Active Member of Clubs or Organizations:   . Attends Banker Meetings:   Marland Kitchen Marital Status:   Intimate Partner Violence:   . Fear of Current or Ex-Partner:   . Emotionally  Abused:   Marland Kitchen Physically Abused:   . Sexually Abused:    Health Maintenance  Topic Date Due  . MAMMOGRAM  07/04/2016  . COLONOSCOPY  06/27/2019  . INFLUENZA VACCINE  06/23/2020  . PAP SMEAR-Modifier  07/06/2021  . TETANUS/TDAP  02/21/2030  . Hepatitis C Screening  Completed  . HIV Screening  Completed    The following portions of the patient's history were reviewed and updated as appropriate: allergies, current medications, past family history, past medical history, past social history, past surgical history and problem list.  Review of Systems A comprehensive review of systems was negative.   Objective:    BP 122/65 (BP Location: Left Arm, Patient Position: Sitting, Cuff Size: Large)   Pulse 73   Temp 98.5 F (36.9 C) (Oral)   Ht 5' 6.14" (1.68 m)   Wt 230 lb 0.6 oz (104.3 kg)   SpO2 98%   BMI 36.97 kg/m  General appearance: alert, cooperative and appears stated age Head: Normocephalic, without obvious abnormality, atraumatic Eyes: conj clear, EOMI, PEERLA Ears: normal TM's and external ear canals both ears Nose: Nares normal. Septum midline. Mucosa normal. No drainage or sinus tenderness. Throat: lips, mucosa, and tongue normal; teeth and gums normal Neck: no adenopathy, no  carotid bruit, no JVD, supple, symmetrical, trachea midline and thyroid not enlarged, symmetric, no tenderness/mass/nodules Back: symmetric, no curvature. ROM normal. No CVA tenderness. Lungs: clear to auscultation bilaterally Breasts: normal appearance, no masses or tenderness Heart: regular rate and rhythm, S1, S2 normal, no murmur, click, rub or gallop Abdomen: soft, non-tender; bowel sounds normal; no masses,  no organomegaly Pelvic: cervix normal in appearance, external genitalia normal, no adnexal masses or tenderness, no cervical motion tenderness, rectovaginal septum normal, uterus normal size, shape, and consistency and vagina normal without discharge Extremities: extremities normal,  atraumatic, no cyanosis or edema Pulses: 2+ and symmetric Skin: Skin color, texture, turgor normal. No rashes or lesions Lymph nodes: Cervical, supraclavicular, and axillary nodes normal. Neurologic: Alert and oriented X 3, normal strength and tone. Normal symmetric reflexes. Normal coordination and gait    Assessment:    Healthy female exam.     Plan:     See After Visit Summary for Counseling Recommendations   Keep up a regular exercise program and make sure you are eating a healthy diet Try to eat 4 servings of dairy a day, or if you are lactose intolerant take a calcium with vitamin D daily.  Your vaccines are up to date.  Tdap updated today.  Declined shingles vaccine. He is due for repeat colon cancer screening this year for 5-year recall. Also due for mammogram.  Order placed.   Hypertension-her blood pressure looks fantastic today.  Cutting back on salt has made a big difference and just encouraged her to stick with that.  Also encouraged her to really work on increasing her activity and exercise levels.

## 2020-02-22 NOTE — Patient Instructions (Addendum)
Gorgeous Blood pressure!!!!!    Health Maintenance, Female Adopting a healthy lifestyle and getting preventive care are important in promoting health and wellness. Ask your health care provider about:  The right schedule for you to have regular tests and exams.  Things you can do on your own to prevent diseases and keep yourself healthy. What should I know about diet, weight, and exercise? Eat a healthy diet   Eat a diet that includes plenty of vegetables, fruits, low-fat dairy products, and lean protein.  Do not eat a lot of foods that are high in solid fats, added sugars, or sodium. Maintain a healthy weight Body mass index (BMI) is used to identify weight problems. It estimates body fat based on height and weight. Your health care provider can help determine your BMI and help you achieve or maintain a healthy weight. Get regular exercise Get regular exercise. This is one of the most important things you can do for your health. Most adults should:  Exercise for at least 150 minutes each week. The exercise should increase your heart rate and make you sweat (moderate-intensity exercise).  Do strengthening exercises at least twice a week. This is in addition to the moderate-intensity exercise.  Spend less time sitting. Even light physical activity can be beneficial. Watch cholesterol and blood lipids Have your blood tested for lipids and cholesterol at 57 years of age, then have this test every 5 years. Have your cholesterol levels checked more often if:  Your lipid or cholesterol levels are high.  You are older than 57 years of age.  You are at high risk for heart disease. What should I know about cancer screening? Depending on your health history and family history, you may need to have cancer screening at various ages. This may include screening for:  Breast cancer.  Cervical cancer.  Colorectal cancer.  Skin cancer.  Lung cancer. What should I know about heart  disease, diabetes, and high blood pressure? Blood pressure and heart disease  High blood pressure causes heart disease and increases the risk of stroke. This is more likely to develop in people who have high blood pressure readings, are of African descent, or are overweight.  Have your blood pressure checked: ? Every 3-5 years if you are 62-3 years of age. ? Every year if you are 38 years old or older. Diabetes Have regular diabetes screenings. This checks your fasting blood sugar level. Have the screening done:  Once every three years after age 77 if you are at a normal weight and have a low risk for diabetes.  More often and at a younger age if you are overweight or have a high risk for diabetes. What should I know about preventing infection? Hepatitis B If you have a higher risk for hepatitis B, you should be screened for this virus. Talk with your health care provider to find out if you are at risk for hepatitis B infection. Hepatitis C Testing is recommended for:  Everyone born from [birth year] through 1963.  Anyone with known risk factors for hepatitis C. Sexually transmitted infections (STIs)  Get screened for STIs, including gonorrhea and chlamydia, if: ? You are sexually active and are younger than 57 years of age. ? You are older than 57 years of age and your health care provider tells you that you are at risk for this type of infection. ? Your sexual activity has changed since you were last screened, and you are at increased risk for chlamydia or gonorrhea.  Ask your health care provider if you are at risk.  Ask your health care provider about whether you are at high risk for HIV. Your health care provider may recommend a prescription medicine to help prevent HIV infection. If you choose to take medicine to prevent HIV, you should first get tested for HIV. You should then be tested every 3 months for as long as you are taking the medicine. Pregnancy  If you are about to stop  having your period (premenopausal) and you may become pregnant, seek counseling before you get pregnant.  Take 400 to 800 micrograms (mcg) of folic acid every day if you become pregnant.  Ask for birth control (contraception) if you want to prevent pregnancy. Osteoporosis and menopause Osteoporosis is a disease in which the bones lose minerals and strength with aging. This can result in bone fractures. If you are 65 years old or older, or if you are at risk for osteoporosis and fractures, ask your health care provider if you should:  Be screened for bone loss.  Take a calcium or vitamin D supplement to lower your risk of fractures.  Be given hormone replacement therapy (HRT) to treat symptoms of menopause. Follow these instructions at home: Lifestyle  Do not use any products that contain nicotine or tobacco, such as cigarettes, e-cigarettes, and chewing tobacco. If you need help quitting, ask your health care provider.  Do not use street drugs.  Do not share needles.  Ask your health care provider for help if you need support or information about quitting drugs. Alcohol use  Do not drink alcohol if: ? Your health care provider tells you not to drink. ? You are pregnant, may be pregnant, or are planning to become pregnant.  If you drink alcohol: ? Limit how much you use to 0-1 drink a day. ? Limit intake if you are breastfeeding.  Be aware of how much alcohol is in your drink. In the [place] one drink equals one 12 oz bottle of beer (355 mL), one 5 oz glass of wine (148 mL), or one 1 oz glass of hard liquor (44 mL). General instructions  Schedule regular health, dental, and eye exams.  Stay current with your vaccines.  Tell your health care provider if: ? You often feel depressed. ? You have ever been abused or do not feel safe at home. Summary  Adopting a healthy lifestyle and getting preventive care are important in promoting health and wellness.  Follow your health  care provider's instructions about healthy diet, exercising, and getting tested or screened for diseases.  Follow your health care provider's instructions on monitoring your cholesterol and blood pressure. This information is not intended to replace advice given to you by your health care provider. Make sure you discuss any questions you have with your health care provider. Document Revised: 11/02/2018 Document Reviewed: 11/02/2018 Elsevier Patient Education  2020 [employer].

## 2020-02-23 LAB — LIPID PANEL
Cholesterol: 208 mg/dL — ABNORMAL HIGH (ref <200)
HDL: 53 mg/dL (ref >=50)
LDL Cholesterol (Calc): 132 mg/dL (calc) — ABNORMAL HIGH
Non-HDL Cholesterol (Calc): 155 mg/dL (calc) — ABNORMAL HIGH (ref <130)
Total CHOL/HDL Ratio: 3.9 (calc) (ref <5.0)
Triglycerides: 122 mg/dL (ref <150)

## 2020-02-23 LAB — CYTOLOGY - PAP
Comment: NEGATIVE
Diagnosis: NEGATIVE
High risk HPV: NEGATIVE

## 2020-02-23 LAB — COMPLETE METABOLIC PANEL WITH GFR
AG Ratio: 1.1 (calc) (ref 1.0–2.5)
ALT: 19 U/L (ref 6–29)
AST: 21 U/L (ref 10–35)
Albumin: 3.7 g/dL (ref 3.6–5.1)
Alkaline phosphatase (APISO): 147 U/L (ref 37–153)
BUN: 9 mg/dL (ref 7–25)
CO2: 23 mmol/L (ref 20–32)
Calcium: 8.9 mg/dL (ref 8.6–10.4)
Chloride: 108 mmol/L (ref 98–110)
Creat: 0.67 mg/dL (ref 0.50–1.05)
GFR, Est African American: 114 mL/min/{1.73_m2} (ref >=60)
GFR, Est Non African American: 98 mL/min/{1.73_m2} (ref >=60)
Globulin: 3.3 g/dL (calc) (ref 1.9–3.7)
Glucose, Bld: 99 mg/dL (ref 65–99)
Potassium: 4.4 mmol/L (ref 3.5–5.3)
Sodium: 140 mmol/L (ref 135–146)
Total Bilirubin: 0.2 mg/dL (ref 0.2–1.2)
Total Protein: 7 g/dL (ref 6.1–8.1)

## 2020-02-23 LAB — HEMOGLOBIN A1C
Hgb A1c MFr Bld: 6.3 % of total Hgb — ABNORMAL HIGH (ref <5.7)
Mean Plasma Glucose: 134 (calc)
eAG (mmol/L): 7.4 (calc)

## 2020-03-06 ENCOUNTER — Ambulatory Visit (INDEPENDENT_AMBULATORY_CARE_PROVIDER_SITE_OTHER): Payer: 59

## 2020-03-06 ENCOUNTER — Other Ambulatory Visit: Payer: Self-pay

## 2020-03-06 DIAGNOSIS — Z1231 Encounter for screening mammogram for malignant neoplasm of breast: Secondary | ICD-10-CM

## 2020-03-21 ENCOUNTER — Telehealth: Payer: Self-pay | Admitting: Family Medicine

## 2020-03-21 DIAGNOSIS — Z9884 Bariatric surgery status: Secondary | ICD-10-CM

## 2020-03-21 NOTE — Telephone Encounter (Signed)
Hi Janet Galloway we really need to check your B12 and iron bc of your bariatric surgery history. If you are ok with getting these we can fax an order to the labs to have these drawn.

## 2020-03-22 DIAGNOSIS — Z9884 Bariatric surgery status: Secondary | ICD-10-CM | POA: Insufficient documentation

## 2020-03-22 NOTE — Telephone Encounter (Signed)
Patient agreed to have B12 and iron checked.

## 2020-08-22 ENCOUNTER — Ambulatory Visit: Payer: 59 | Admitting: Family Medicine

## 2020-08-26 ENCOUNTER — Encounter: Payer: Self-pay | Admitting: Family Medicine

## 2020-08-26 ENCOUNTER — Ambulatory Visit (INDEPENDENT_AMBULATORY_CARE_PROVIDER_SITE_OTHER): Payer: 59 | Admitting: Family Medicine

## 2020-08-26 VITALS — BP 125/47 | HR 75 | Ht 66.0 in | Wt 225.0 lb

## 2020-08-26 DIAGNOSIS — I10 Essential (primary) hypertension: Secondary | ICD-10-CM

## 2020-08-26 DIAGNOSIS — Z9884 Bariatric surgery status: Secondary | ICD-10-CM

## 2020-08-26 DIAGNOSIS — K912 Postsurgical malabsorption, not elsewhere classified: Secondary | ICD-10-CM

## 2020-08-26 DIAGNOSIS — Z8739 Personal history of other diseases of the musculoskeletal system and connective tissue: Secondary | ICD-10-CM

## 2020-08-26 DIAGNOSIS — R7301 Impaired fasting glucose: Secondary | ICD-10-CM | POA: Diagnosis not present

## 2020-08-26 DIAGNOSIS — Z903 Acquired absence of stomach [part of]: Secondary | ICD-10-CM

## 2020-08-26 LAB — POCT GLYCOSYLATED HEMOGLOBIN (HGB A1C): Hemoglobin A1C: 6.1 % — AB (ref 4.0–5.6)

## 2020-08-26 NOTE — Assessment & Plan Note (Signed)
Well controlled. Continue current regimen. Follow up in  6 mo  

## 2020-08-26 NOTE — Progress Notes (Signed)
Established Patient Office Visit  Subjective:  Patient ID: Janet Galloway, female    DOB: 06-03-1961  Age: 59 y.o. MRN: [id number]  CC:  Chief Complaint  Patient presents with  . Hypertension  . ifg    HPI Draya L Gibson-Boler presents for   Hypertension- Pt denies chest pain, SOB, dizziness, or heart palpitations.  Taking meds as directed w/o problems.  Denies medication side effects.    Impaired fasting glucose-no increased thirst or urination. No symptoms consistent with hypoglycemia.  He has lost 5 pounds since she was last here.  Also wanted to let me know about an episode she had she been helping her husband work underneath his truck and had been getting up and down and lying flat on her back when she started to feel nauseated.  She then sat up felt a little dizzy and then vomited just once.  She has not had it recur since then.  He also wanted to let me know that in regards to the rheumatoid arthritis she wonders if it may or may not have been the correct diagnosis is since coming off the methotrexate a couple years ago all of her symptoms have resolved she is no longer experiencing joint pain or swelling.   Past Medical History:  Diagnosis Date  . Arthritis   . Hyperlipidemia     Past Surgical History:  Procedure Laterality Date  . LAPAROSCOPIC GASTRIC SLEEVE RESECTION  11/2013    Family History  Problem Relation Age of Onset  . Diabetes Maternal Aunt   . Breast cancer Maternal Aunt        breast    Social History   Socioeconomic History  . Marital status: Married    Spouse name: Not on file  . Number of children: Not on file  . Years of education: Not on file  . Highest education level: Not on file  Occupational History  . Not on file  Tobacco Use  . Smoking status: Never Smoker  . Smokeless tobacco: Never Used  Vaping Use  . Vaping Use: Never used  Substance and Sexual Activity  . Alcohol use: No  . Drug use: No  . Sexual activity: Yes   Other Topics Concern  . Not on file  Social History Narrative  . Not on file   Social Determinants of Health   Financial Resource Strain:   . Difficulty of Paying Living Expenses: Not on file  Food Insecurity:   . Worried About Programme researcher, broadcasting/film/video in the Last Year: Not on file  . Ran Out of Food in the Last Year: Not on file  Transportation Needs:   . Lack of Transportation (Medical): Not on file  . Lack of Transportation (Non-Medical): Not on file  Physical Activity:   . Days of Exercise per Week: Not on file  . Minutes of Exercise per Session: Not on file  Stress:   . Feeling of Stress : Not on file  Social Connections:   . Frequency of Communication with Friends and Family: Not on file  . Frequency of Social Gatherings with Friends and Family: Not on file  . Attends Religious Services: Not on file  . Active Member of Clubs or Organizations: Not on file  . Attends Banker Meetings: Not on file  . Marital Status: Not on file  Intimate Partner Violence:   . Fear of Current or Ex-Partner: Not on file  . Emotionally Abused: Not on file  . Physically Abused:  Not on file  . Sexually Abused: Not on file    Outpatient Medications Prior to Visit  Medication Sig Dispense Refill  . calcium carbonate (OSCAL) 1500 (600 Ca) MG TABS tablet Take by mouth.     No facility-administered medications prior to visit.    Allergies  Allergen Reactions  . Acetaminophen Itching    Had abnormal LFTs in past with using tylenol in large amounts for RA    ROS Review of Systems    Objective:    Physical Exam Constitutional:      Appearance: She is well-developed.  HENT:     Head: Normocephalic and atraumatic.  Cardiovascular:     Rate and Rhythm: Normal rate and regular rhythm.     Heart sounds: Normal heart sounds.  Pulmonary:     Effort: Pulmonary effort is normal.     Breath sounds: Normal breath sounds.  Skin:    General: Skin is warm and dry.  Neurological:      Mental Status: She is alert and oriented to person, place, and time.  Psychiatric:        Behavior: Behavior normal.     BP (!) 125/47   Pulse 75   Ht 5\' 6"  (1.676 m)   Wt 225 lb (102.1 kg)   SpO2 99%   BMI 36.32 kg/m  Wt Readings from Last 3 Encounters:  08/26/20 225 lb (102.1 kg)  02/22/20 230 lb 0.6 oz (104.3 kg)  07/06/16 204 lb (92.5 kg)     Health Maintenance Due  Topic Date Due  . COLONOSCOPY  06/27/2019    There are no preventive care reminders to display for this patient.  Lab Results  Component Value Date   TSH 1.042 05/01/2010   Lab Results  Component Value Date   WBC 5.1 05/01/2010   HGB 14.2 05/01/2010   HCT 43.6 05/01/2010   MCV 95.4 05/01/2010   PLT 264 05/01/2010   Lab Results  Component Value Date   NA 140 02/22/2020   K 4.4 02/22/2020   CO2 23 02/22/2020   GLUCOSE 99 02/22/2020   BUN 9 02/22/2020   CREATININE 0.67 02/22/2020   BILITOT 0.2 02/22/2020   ALKPHOS 124 07/06/2016   AST 21 02/22/2020   ALT 19 02/22/2020   PROT 7.0 02/22/2020   ALBUMIN 3.8 07/06/2016   CALCIUM 8.9 02/22/2020   Lab Results  Component Value Date   CHOL 208 (H) 02/22/2020   Lab Results  Component Value Date   HDL 53 02/22/2020   Lab Results  Component Value Date   LDLCALC 132 (H) 02/22/2020   Lab Results  Component Value Date   TRIG 122 02/22/2020   Lab Results  Component Value Date   CHOLHDL 3.9 02/22/2020   Lab Results  Component Value Date   HGBA1C 6.1 (A) 08/26/2020      Assessment & Plan:   Problem List Items Addressed This Visit      Cardiovascular and Mediastinum   Essential hypertension, benign    Well controlled. Continue current regimen. Follow up in  6 mo      Relevant Orders   Fe+TIBC+Fer   B12   B12 and Folate Panel   Magnesium   Vitamin B1   Vitamin B6   Ferritin   CBC     Digestive   Intestinal malabsorption following gastrectomy    Due for yearly check for nutritional deficiencies.  Status post bariatric  surgery.        Endocrine  IFG (impaired fasting glucose) - Primary    A1c looks much better at 6.1 today.  Continue current regimen.      Relevant Orders   POCT glycosylated hemoglobin (Hb A1C) (Completed)   Fe+TIBC+Fer   B12   B12 and Folate Panel   Magnesium   Vitamin B1   Vitamin B6   Ferritin   CBC     Other   Hx of bariatric surgery   Relevant Orders   Fe+TIBC+Fer   B12   B12 and Folate Panel   Magnesium   Vitamin B1   Vitamin B6   Ferritin   CBC   History of rheumatoid arthritis    Currently in remission over the last couple of years not on medications pain and joint swelling have resolved.        For colonoscopy.  Reminded her to schedule.  She did get her first COVID vaccine.  No orders of the defined types were placed in this encounter.   Follow-up: No follow-ups on file.    Nani Gasser, MD

## 2020-08-26 NOTE — Assessment & Plan Note (Signed)
Due for yearly check for nutritional deficiencies.  Status post bariatric surgery.

## 2020-08-26 NOTE — Assessment & Plan Note (Signed)
Currently in remission over the last couple of years not on medications pain and joint swelling have resolved.

## 2020-08-26 NOTE — Assessment & Plan Note (Signed)
A1c looks much better at 6.1 today.  Continue current regimen.

## 2020-08-30 ENCOUNTER — Other Ambulatory Visit: Payer: Self-pay

## 2020-08-30 DIAGNOSIS — D508 Other iron deficiency anemias: Secondary | ICD-10-CM

## 2020-08-30 DIAGNOSIS — R7989 Other specified abnormal findings of blood chemistry: Secondary | ICD-10-CM

## 2020-08-30 LAB — B12 AND FOLATE PANEL
Folate: 5.2 ng/mL — ABNORMAL LOW
Vitamin B-12: 280 pg/mL (ref 200–1100)

## 2020-08-30 LAB — VITAMIN B6: Vitamin B6: 3.6 ng/mL (ref 2.1–21.7)

## 2020-08-30 LAB — CBC
HCT: 28.8 % — ABNORMAL LOW (ref 35.0–45.0)
Hemoglobin: 8.6 g/dL — ABNORMAL LOW (ref 11.7–15.5)
MCH: 22.1 pg — ABNORMAL LOW (ref 27.0–33.0)
MCHC: 29.9 g/dL — ABNORMAL LOW (ref 32.0–36.0)
MCV: 73.8 fL — ABNORMAL LOW (ref 80.0–100.0)
MPV: 11 fL (ref 7.5–12.5)
Platelets: 392 10*3/uL (ref 140–400)
RBC: 3.9 10*6/uL (ref 3.80–5.10)
RDW: 17.8 % — ABNORMAL HIGH (ref 11.0–15.0)
WBC: 5.1 10*3/uL (ref 3.8–10.8)

## 2020-08-30 LAB — IRON,TIBC AND FERRITIN PANEL
%SAT: 3 % (calc) — ABNORMAL LOW (ref 16–45)
Ferritin: 3 ng/mL — ABNORMAL LOW (ref 16–232)
Iron: 14 ug/dL — ABNORMAL LOW (ref 45–160)
TIBC: 431 mcg/dL (calc) (ref 250–450)

## 2020-08-30 LAB — VITAMIN B1: Vitamin B1 (Thiamine): 10 nmol/L (ref 8–30)

## 2020-08-30 LAB — MAGNESIUM: Magnesium: 2 mg/dL (ref 1.5–2.5)

## 2021-02-24 ENCOUNTER — Encounter: Payer: 59 | Admitting: Family Medicine

## 2022-08-19 ENCOUNTER — Ambulatory Visit (INDEPENDENT_AMBULATORY_CARE_PROVIDER_SITE_OTHER): Payer: 59 | Admitting: Family Medicine

## 2022-08-19 ENCOUNTER — Encounter: Payer: Self-pay | Admitting: Family Medicine

## 2022-08-19 VITALS — BP 135/54 | HR 72 | Ht 66.0 in | Wt 207.0 lb

## 2022-08-19 DIAGNOSIS — Z0000 Encounter for general adult medical examination without abnormal findings: Secondary | ICD-10-CM

## 2022-08-19 DIAGNOSIS — Z1231 Encounter for screening mammogram for malignant neoplasm of breast: Secondary | ICD-10-CM

## 2022-08-19 DIAGNOSIS — R7301 Impaired fasting glucose: Secondary | ICD-10-CM

## 2022-08-19 DIAGNOSIS — Z1211 Encounter for screening for malignant neoplasm of colon: Secondary | ICD-10-CM

## 2022-08-19 DIAGNOSIS — M0579 Rheumatoid arthritis with rheumatoid factor of multiple sites without organ or systems involvement: Secondary | ICD-10-CM

## 2022-08-19 DIAGNOSIS — M069 Rheumatoid arthritis, unspecified: Secondary | ICD-10-CM | POA: Insufficient documentation

## 2022-08-19 MED ORDER — METHOTREXATE 2.5 MG PO TABS: 10.0000 mg | ORAL_TABLET | ORAL | 1 refills | Status: AC

## 2022-08-19 MED ORDER — PREDNISONE 20 MG PO TABS: ORAL_TABLET | ORAL | 0 refills | Status: DC

## 2022-08-19 MED ORDER — FOLIC ACID 1 MG PO TABS: 1.0000 mg | ORAL_TABLET | Freq: Every day | ORAL | 3 refills | Status: AC

## 2022-08-19 MED ORDER — PREDNISONE 20 MG PO TABS: 40.0000 mg | ORAL_TABLET | Freq: Every day | ORAL | 0 refills | Status: DC

## 2022-08-19 NOTE — Assessment & Plan Note (Signed)
Due for A1C  

## 2022-08-19 NOTE — Progress Notes (Signed)
Complete physical exam  Patient: Janet Galloway   DOB: 14-Jan-1961   61 y.o. Female  MRN: [id number]  Subjective:    Chief Complaint  Patient presents with   Annual Exam    Janet Galloway is a 61 y.o. female who presents today for a complete physical exam. She reports consuming a general diet. She generally feels well.. She does have additional problems to discuss today.  Has been 2 years since las in the office.   She was diagnosed with rheumatoid arthritis years ago and eventually got pretty good control and quit going about 2 years ago she was previously followed at [facility] she was on methotrexate.  She has been more recently having significant flares in fact the MCPs on her index and middle fingers are really quite swollen today and she would like to get back in with rheumatology and start treatment again.   Most recent fall risk assessment:    08/19/2022    9:27 AM  Fall Risk   Falls in the past year? 0  Number falls in past yr: 0  Injury with Fall? 0  Risk for fall due to : No Fall Risks  Follow up Falls evaluation completed     Most recent depression screenings:    08/19/2022    9:28 AM 02/22/2020    8:30 AM  PHQ 2/9 Scores  PHQ - 2 Score 0 0  PHQ- 9 Score  0        Patient Care Team: Agapito Games, MD as PCP - General   No outpatient medications prior to visit.   No facility-administered medications prior to visit.    ROS        Objective:     BP (!) 135/54   Pulse 72   Ht 5\' 6"  (1.676 m)   Wt 207 lb (93.9 kg)   SpO2 100%   BMI 33.41 kg/m    Physical Exam Vitals and nursing note reviewed. Exam conducted with a chaperone present.  Constitutional:      Appearance: She is well-developed.  HENT:     Head: Normocephalic and atraumatic.     Right Ear: External ear normal.     Left Ear: External ear normal.     Nose: Nose normal.  Eyes:     Conjunctiva/sclera: Conjunctivae normal.     Pupils: Pupils are equal,  round, and reactive to light.  Neck:     Thyroid: No thyromegaly.  Cardiovascular:     Rate and Rhythm: Normal rate and regular rhythm.     Heart sounds: Normal heart sounds.  Pulmonary:     Effort: Pulmonary effort is normal.     Breath sounds: Normal breath sounds. No wheezing.  Chest:     Chest wall: No mass.  Breasts:    Right: Normal.     Left: Normal.  Abdominal:     General: Bowel sounds are normal.     Palpations: Abdomen is soft.  Musculoskeletal:     Cervical back: Neck supple.     Comments: Significant swelling of the MCP of the index and middle finger on her right hand.  Lymphadenopathy:     Cervical: No cervical adenopathy.  Skin:    General: Skin is warm and dry.  Neurological:     Mental Status: She is alert and oriented to person, place, and time.  Psychiatric:        Mood and Affect: Mood normal.  Behavior: Behavior normal.      No results found for any visits on 08/19/22.     Assessment & Plan:    Routine Health Maintenance and Physical Exam  Immunization History  Administered Date(s) Administered   Influenza Whole 09/22/2007   PFIZER(Purple Top)SARS-COV-2 Vaccination 08/14/2020, 09/04/2020   PPD Test 01/10/2008   Pneumococcal Conjugate-13 06/25/2016   Tdap 11/17/2005, 02/22/2020    Health Maintenance  Topic Date Due   COLONOSCOPY (Pts 45-57yrs Insurance coverage will need to be confirmed)  06/27/2019   MAMMOGRAM  03/06/2022   INFLUENZA VACCINE  02/21/2023 (Originally 06/23/2022)   COVID-19 Vaccine (3 - Pfizer series) 09/05/2023 (Originally 10/30/2020)   Zoster Vaccines- Shingrix (1 of 2) 11/19/2023 (Originally 11/07/2013)   PAP SMEAR-Modifier  02/21/2025   TETANUS/TDAP  02/21/2030   Hepatitis C Screening  Completed   HIV Screening  Completed   Pneumococcal Vaccine 16-11 Years old  Aged Out   HPV VACCINES  Aged Out    Discussed health benefits of physical activity, and encouraged her to engage in regular exercise appropriate for her  age and condition.  Problem List Items Addressed This Visit       Endocrine   IFG (impaired fasting glucose)    Due for A1C        Musculoskeletal and Integument   Rheumatoid arthritis ([place])    We discussed going ahead and giving her a round of prednisone to try to calm down her current flare and restart methotrexate."  And place a rheumatology referral.  She can let me know if they are quite booked out.      Relevant Medications   methotrexate (RHEUMATREX) 2.5 MG tablet   folic acid (FOLVITE) 1 MG tablet   predniSONE (DELTASONE) 20 MG tablet   Other Relevant Orders   Ambulatory referral to Rheumatology   Other Visit Diagnoses     Routine general medical examination at a health care facility    -  Primary   Relevant Orders   COMPLETE METABOLIC PANEL WITH GFR   Lipid panel   Fe+TIBC+Fer   B12   HgB A1c   Screening mammogram for breast cancer       Relevant Orders   MM 3D SCREEN BREAST BILATERAL   Screen for colon cancer       Relevant Orders   Ambulatory referral to Gastroenterology      Keep up a regular exercise program and make sure you are eating a healthy diet Try to eat 4 servings of dairy a day, or if you are lactose intolerant take a calcium with vitamin D daily.  Your vaccines are up to date.   Return in about 1 year (around 08/20/2023) for Wellness Exam.     Beatrice Lecher, MD

## 2022-08-19 NOTE — Assessment & Plan Note (Signed)
We discussed going ahead and giving her a round of prednisone to try to calm down her current flare and restart methotrexate."  And place a rheumatology referral.  She can let me know if they are quite booked out.

## 2022-09-02 ENCOUNTER — Ambulatory Visit: Payer: Self-pay

## 2022-09-07 ENCOUNTER — Other Ambulatory Visit: Payer: Self-pay | Admitting: Family Medicine

## 2022-09-10 ENCOUNTER — Ambulatory Visit (INDEPENDENT_AMBULATORY_CARE_PROVIDER_SITE_OTHER): Payer: 59

## 2022-09-10 DIAGNOSIS — Z1231 Encounter for screening mammogram for malignant neoplasm of breast: Secondary | ICD-10-CM | POA: Diagnosis not present

## 2022-09-11 LAB — LIPID PANEL
Cholesterol: 201 mg/dL — ABNORMAL HIGH (ref <200)
HDL: 60 mg/dL (ref >=50)
LDL Cholesterol (Calc): 120 mg/dL (calc) — ABNORMAL HIGH
Non-HDL Cholesterol (Calc): 141 mg/dL (calc) — ABNORMAL HIGH (ref <130)
Total CHOL/HDL Ratio: 3.4 (calc) (ref <5.0)
Triglycerides: 104 mg/dL (ref <150)

## 2022-09-11 LAB — COMPLETE METABOLIC PANEL WITH GFR
AG Ratio: 1.1 (calc) (ref 1.0–2.5)
ALT: 16 U/L (ref 6–29)
AST: 17 U/L (ref 10–35)
Albumin: 3.7 g/dL (ref 3.6–5.1)
Alkaline phosphatase (APISO): 97 U/L (ref 37–153)
BUN: 10 mg/dL (ref 7–25)
CO2: 26 mmol/L (ref 20–32)
Calcium: 9.2 mg/dL (ref 8.6–10.4)
Chloride: 109 mmol/L (ref 98–110)
Creat: 0.59 mg/dL (ref 0.50–1.03)
Globulin: 3.3 g/dL (calc) (ref 1.9–3.7)
Glucose, Bld: 96 mg/dL (ref 65–99)
Potassium: 4.5 mmol/L (ref 3.5–5.3)
Sodium: 142 mmol/L (ref 135–146)
Total Bilirubin: 0.2 mg/dL (ref 0.2–1.2)
Total Protein: 7 g/dL (ref 6.1–8.1)
eGFR: 104 mL/min/{1.73_m2} (ref >=60)

## 2022-09-11 LAB — IRON,TIBC AND FERRITIN PANEL
%SAT: 3 % (calc) — ABNORMAL LOW (ref 16–45)
Ferritin: 2 ng/mL — ABNORMAL LOW (ref 16–232)
Iron: 14 ug/dL — ABNORMAL LOW (ref 45–160)
TIBC: 434 mcg/dL (calc) (ref 250–450)

## 2022-09-11 LAB — HEMOGLOBIN A1C
Hgb A1c MFr Bld: 6.3 % of total Hgb — ABNORMAL HIGH (ref <5.7)
Mean Plasma Glucose: 134 mg/dL
eAG (mmol/L): 7.4 mmol/L

## 2022-09-11 LAB — VITAMIN B12: Vitamin B-12: 242 pg/mL (ref 200–1100)

## 2022-09-11 NOTE — Progress Notes (Signed)
Call patient: In the left breast they saw a questionable area just a little asymmetry.  The imaging department will be contacting her soon to schedule further imaging.

## 2022-09-14 ENCOUNTER — Other Ambulatory Visit: Payer: Self-pay | Admitting: Family Medicine

## 2022-09-14 DIAGNOSIS — R928 Other abnormal and inconclusive findings on diagnostic imaging of breast: Secondary | ICD-10-CM

## 2022-09-16 ENCOUNTER — Other Ambulatory Visit: Payer: Self-pay

## 2022-09-16 DIAGNOSIS — Z903 Acquired absence of stomach [part of]: Secondary | ICD-10-CM

## 2022-09-16 NOTE — Progress Notes (Signed)
Shecould try taking the medication daily with a stool softener.  Sometimes that will counterbalance the constipation.  If not then she can always try taking it every other day.  We can recheck her level in about 8 weeks just to make sure that her body is absorbing it.  Also work on eating a lot of iron rich foods.  If she is able to get most of her iron from her food then that type of iron is not constipating.

## 2022-09-16 NOTE — Progress Notes (Signed)
Call patient: LDL cholesterol is at mildly elevated at 120.  But it is an improvement compared to 2 years ago which is great!  Iron levels are still extremely low similar to what they were about 2 years ago.  Are you taking any extra iron right now?  A1c went up slightly to 6.3 but still in the prediabetes range.  Continue to work on healthy diet and regular exercise.  Metabolic panel is normal.  B12 is normal but on the low end.  I would recommend starting an over-the-counter B12 supplement daily.  We can always go in more detail over the labs at your appointment.

## 2022-09-21 ENCOUNTER — Ambulatory Visit
Admission: RE | Admit: 2022-09-21 | Discharge: 2022-09-21 | Disposition: A | Discharge status: Home / Self Care | Payer: 59 | Source: Ambulatory Visit | Attending: Family Medicine | Admitting: Family Medicine

## 2022-09-21 ENCOUNTER — Other Ambulatory Visit: Payer: 59

## 2022-09-21 ENCOUNTER — Ambulatory Visit: Payer: 59

## 2022-09-21 DIAGNOSIS — R928 Other abnormal and inconclusive findings on diagnostic imaging of breast: Secondary | ICD-10-CM

## 2022-11-05 ENCOUNTER — Other Ambulatory Visit: Payer: Self-pay | Admitting: Family Medicine

## 2022-12-21 LAB — HM COLONOSCOPY

## 2023-01-22 ENCOUNTER — Encounter: Payer: Self-pay | Admitting: Family Medicine

## 2023-04-28 ENCOUNTER — Telehealth: Payer: Self-pay | Admitting: Family Medicine

## 2023-04-28 DIAGNOSIS — Z111 Encounter for screening for respiratory tuberculosis: Secondary | ICD-10-CM

## 2023-04-28 NOTE — Telephone Encounter (Signed)
Lab ordered.

## 2023-04-28 NOTE — Telephone Encounter (Signed)
Pt advised.

## 2023-04-28 NOTE — Telephone Encounter (Signed)
Patient is requesting a blood work for TB test

## 2023-05-01 LAB — QUANTIFERON-TB GOLD PLUS
Mitogen-NIL: >10 IU/mL
NIL: 0.04 IU/mL
QuantiFERON-TB Gold Plus: NEGATIVE
TB1-NIL: <0 IU/mL
TB2-NIL: 0 IU/mL

## 2023-05-03 NOTE — Progress Notes (Signed)
Negative for TB. She will likely need a copy of report.

## 2023-05-20 ENCOUNTER — Other Ambulatory Visit: Payer: Self-pay | Admitting: Family Medicine

## 2023-09-15 ENCOUNTER — Other Ambulatory Visit: Payer: Self-pay | Admitting: Family Medicine

## 2023-09-15 DIAGNOSIS — Z1231 Encounter for screening mammogram for malignant neoplasm of breast: Secondary | ICD-10-CM

## 2023-10-18 ENCOUNTER — Ambulatory Visit: Payer: 59

## 2023-10-18 ENCOUNTER — Ambulatory Visit
Admission: RE | Admit: 2023-10-18 | Discharge: 2023-10-18 | Disposition: A | Discharge status: Home / Self Care | Payer: 59 | Source: Ambulatory Visit | Attending: Family Medicine | Admitting: Family Medicine

## 2023-10-18 DIAGNOSIS — Z1231 Encounter for screening mammogram for malignant neoplasm of breast: Secondary | ICD-10-CM

## 2023-10-19 NOTE — Progress Notes (Signed)
Please call patient. Normal mammogram.  Repeat in 1 year.  

## 2024-08-24 ENCOUNTER — Encounter: Payer: Self-pay | Admitting: Family Medicine

## 2024-08-24 ENCOUNTER — Ambulatory Visit (INDEPENDENT_AMBULATORY_CARE_PROVIDER_SITE_OTHER): Payer: Self-pay | Admitting: Family Medicine

## 2024-08-24 VITALS — BP 136/59 | HR 65 | Temp 98.4°F | Ht 64.0 in | Wt 208.0 lb

## 2024-08-24 DIAGNOSIS — Z9884 Bariatric surgery status: Secondary | ICD-10-CM | POA: Diagnosis not present

## 2024-08-24 DIAGNOSIS — Z1231 Encounter for screening mammogram for malignant neoplasm of breast: Secondary | ICD-10-CM | POA: Diagnosis not present

## 2024-08-24 DIAGNOSIS — Z7689 Persons encountering health services in other specified circumstances: Secondary | ICD-10-CM | POA: Insufficient documentation

## 2024-08-24 DIAGNOSIS — D649 Anemia, unspecified: Secondary | ICD-10-CM

## 2024-08-24 DIAGNOSIS — Z0000 Encounter for general adult medical examination without abnormal findings: Secondary | ICD-10-CM | POA: Diagnosis not present

## 2024-08-24 NOTE — Progress Notes (Signed)
 Complete physical exam  Patient: Janet Galloway   DOB: 02-13-1961   62 y.o. Female  MRN: [id number]  Subjective:    Chief Complaint  Patient presents with   Annual Exam    Janet Galloway is a 63 y.o. female who presents today for a complete physical exam. She reports consuming a general diet. Walks for exercise  She generally feels well. She reports sleeping fairly well. She does not have additional problems to discuss today.    Most recent fall risk assessment:    08/19/2022    9:27 AM  Fall Risk   Falls in the past year? 0  Number falls in past yr: 0  Injury with Fall? 0  Risk for fall due to : No Fall Risks  Follow up Falls evaluation completed      Data saved with a previous flowsheet row definition     Most recent depression screenings:    08/24/2024    9:35 AM 08/19/2022    9:28 AM  PHQ 2/9 Scores  PHQ - 2 Score 0 0  PHQ- 9 Score 1          Patient Care Team: Alvan Dorothyann BIRCH, MD as PCP - General   Outpatient Medications Prior to Visit  Medication Sig   folic acid  (FOLVITE ) 1 MG tablet Take 1 tablet (1 mg total) by mouth daily.   methotrexate  (RHEUMATREX) 2.5 MG tablet Take 4 tablets (10 mg total) by mouth once a week. Caution:Chemotherapy. Protect from light.   No facility-administered medications prior to visit.    ROS      Objective:     BP (!) 136/59 (BP Location: Right Arm, Patient Position: Sitting, Cuff Size: Normal)   Pulse 65   Temp 98.4 F (36.9 C) (Oral)   Ht 5' 4 (1.626 m)   Wt 208 lb (94.3 kg)   SpO2 100%   BMI 35.70 kg/m     Physical Exam Constitutional:      Appearance: Normal appearance.  HENT:     Head: Normocephalic and atraumatic.     Right Ear: Tympanic membrane, ear canal and external ear normal.     Left Ear: Tympanic membrane, ear canal and external ear normal.     Nose: Nose normal.     Mouth/Throat:     Pharynx: Oropharynx is clear.  Eyes:     Extraocular Movements: Extraocular  movements intact.     Conjunctiva/sclera: Conjunctivae normal.     Pupils: Pupils are equal, round, and reactive to light.  Neck:     Thyroid: No thyromegaly.  Cardiovascular:     Rate and Rhythm: Normal rate and regular rhythm.  Pulmonary:     Effort: Pulmonary effort is normal.     Breath sounds: Normal breath sounds.  Abdominal:     General: Bowel sounds are normal.     Palpations: Abdomen is soft.     Tenderness: There is no abdominal tenderness.  Musculoskeletal:        General: No swelling.     Cervical back: Neck supple.  Skin:    General: Skin is warm and dry.  Neurological:     Mental Status: She is oriented to person, place, and time.  Psychiatric:        Mood and Affect: Mood normal.        Behavior: Behavior normal.      No results found for any visits on 08/24/24.      Assessment & Plan:  Routine Health Maintenance and Physical Exam  Immunization History  Administered Date(s) Administered   Influenza Whole 09/22/2007   PFIZER(Purple Top)SARS-COV-2 Vaccination 08/14/2020, 09/04/2020   PPD Test 01/10/2008   Pneumococcal Conjugate-13 06/25/2016   Tdap 11/17/2005, 02/22/2020    Health Maintenance  Topic Date Due   Zoster Vaccines- Shingrix (1 of 2) Never done   COVID-19 Vaccine (3 - Pfizer risk series) 09/09/2024 (Originally 10/02/2020)   Influenza Vaccine  02/20/2025 (Originally 06/23/2024)   Pneumococcal Vaccine: 50+ Years (2 of 2 - PCV20 or PCV21) 08/24/2025 (Originally 06/25/2017)   Cervical Cancer Screening (HPV/Pap Cotest)  02/21/2025   Mammogram  10/17/2025   Colonoscopy  12/22/2027   DTaP/Tdap/Td (3 - Td or Tdap) 02/21/2030   Hepatitis C Screening  Completed   HIV Screening  Completed   Hepatitis B Vaccines 19-59 Average Risk  Aged Out   HPV VACCINES  Aged Out   Meningococcal B Vaccine  Aged Out    Discussed health benefits of physical activity, and encouraged her to engage in regular exercise appropriate for her age and condition.  Problem  List Items Addressed This Visit       Other   Hx of bariatric surgery   Relevant Orders   B12 and Folate Panel   CBC with Differential/Platelet   CMP14+EGFR   Iron, TIBC and Ferritin Panel   Lipid Panel With LDL/HDL Ratio   Vitamin B1   Vitamin B6   Magnesium   Copper, serum   TSH   Encounter for weight management   Other Visit Diagnoses       Wellness examination    -  Primary   Relevant Orders   B12 and Folate Panel   CBC with Differential/Platelet   CMP14+EGFR   Iron, TIBC and Ferritin Panel   Lipid Panel With LDL/HDL Ratio   Vitamin B1   Vitamin B6   Magnesium   Copper, serum   TSH     Screening mammogram for breast cancer         Anemia, unspecified type       Relevant Orders   B12 and Folate Panel   CBC with Differential/Platelet   Iron, TIBC and Ferritin Panel      Keep up a regular exercise program and make sure you are eating a healthy diet Try to eat 4 servings of dairy a day, or if you are lactose intolerant take a calcium with vitamin D daily.  Your vaccines are up to date.  Because of her history of bariatric surgery we did check for other mental deficiencies today as well.  No follow-ups on file.     Dorothyann Byars, MD

## 2024-08-25 ENCOUNTER — Ambulatory Visit: Payer: Self-pay | Admitting: Family Medicine

## 2024-08-25 DIAGNOSIS — E611 Iron deficiency: Secondary | ICD-10-CM

## 2024-08-25 DIAGNOSIS — Z9884 Bariatric surgery status: Secondary | ICD-10-CM

## 2024-08-25 DIAGNOSIS — D649 Anemia, unspecified: Secondary | ICD-10-CM

## 2024-08-25 LAB — CMP14+EGFR
ALT: 20 IU/L (ref 0–32)
AST: 20 IU/L (ref 0–40)
Albumin: 4.5 g/dL (ref 3.8–4.9)
Alkaline Phosphatase: 76 IU/L (ref 49–135)
BUN/Creatinine Ratio: 15 (ref 12–28)
BUN: 11 mg/dL (ref 8–27)
Bilirubin Total: 0.4 mg/dL (ref 0.0–1.2)
CO2: 23 mmol/L (ref 20–29)
Calcium: 9.6 mg/dL (ref 8.7–10.3)
Chloride: 101 mmol/L (ref 96–106)
Creatinine, Ser: 0.75 mg/dL (ref 0.57–1.00)
Globulin, Total: 3 g/dL (ref 1.5–4.5)
Glucose: 99 mg/dL (ref 70–99)
Potassium: 4.1 mmol/L (ref 3.5–5.2)
Sodium: 140 mmol/L (ref 134–144)
Total Protein: 7.5 g/dL (ref 6.0–8.5)
eGFR: 91 mL/min/1.73 (ref >59)

## 2024-08-25 LAB — CBC WITH DIFFERENTIAL/PLATELET
Basophils Absolute: 0 x10E3/uL (ref 0.0–0.2)
Basos: 0 %
EOS (ABSOLUTE): 0.2 x10E3/uL (ref 0.0–0.4)
Eos: 2 %
Hematocrit: 41.9 % (ref 34.0–46.6)
Hemoglobin: 13.8 g/dL (ref 11.1–15.9)
Immature Grans (Abs): 0 x10E3/uL (ref 0.0–0.1)
Immature Granulocytes: 0 %
Lymphocytes Absolute: 1.9 x10E3/uL (ref 0.7–3.1)
Lymphs: 26 %
MCH: 32.1 pg (ref 26.6–33.0)
MCHC: 32.9 g/dL (ref 31.5–35.7)
MCV: 97 fL (ref 79–97)
Monocytes Absolute: 0.6 x10E3/uL (ref 0.1–0.9)
Monocytes: 9 %
Neutrophils Absolute: 4.5 x10E3/uL (ref 1.4–7.0)
Neutrophils: 63 %
Platelets: 300 x10E3/uL (ref 150–450)
RBC: 4.3 x10E6/uL (ref 3.77–5.28)
RDW: 14.5 % (ref 11.7–15.4)
WBC: 7.2 x10E3/uL (ref 3.4–10.8)

## 2024-08-25 LAB — LIPID PANEL WITH LDL/HDL RATIO
Cholesterol, Total: 175 mg/dL (ref 100–199)
HDL: 67 mg/dL (ref >39)
LDL Chol Calc (NIH): 98 mg/dL (ref 0–99)
LDL/HDL Ratio: 1.5 ratio (ref 0.0–3.2)
Triglycerides: 50 mg/dL (ref 0–149)
VLDL Cholesterol Cal: 10 mg/dL (ref 5–40)

## 2024-08-25 LAB — B12 AND FOLATE PANEL
Folate: 10 ng/mL (ref >3.0)
Vitamin B-12: 812 pg/mL (ref 232–1245)

## 2024-08-25 LAB — IRON,TIBC AND FERRITIN PANEL
Ferritin: 178 ng/mL — ABNORMAL HIGH (ref 15–150)
Iron Saturation: 20 % (ref 15–55)
Iron: 65 ug/dL (ref 27–159)
Total Iron Binding Capacity: 323 ug/dL (ref 250–450)
UIBC: 258 ug/dL (ref 131–425)

## 2024-08-25 NOTE — Progress Notes (Signed)
 Call patient: You are extremely anemic!!  It really is important for your health for us  to correct this it can really strain your heart.  Since you do not do great with iron I think we should get you scheduled for an iron infusion.  Have you had that done before?  We do that through our heme-onc department.  Your liver enzyme called alk phos is slightly elevated so I want to see if we can get that number down after we correct the anemia.   B12, magnesium, and folate are normal.  Blood count and cholesterol look okay.  B1 and B6 are still pending.  Copper still pending

## 2024-08-28 ENCOUNTER — Other Ambulatory Visit: Payer: Self-pay | Admitting: Family Medicine

## 2024-08-28 DIAGNOSIS — D509 Iron deficiency anemia, unspecified: Secondary | ICD-10-CM

## 2024-08-28 DIAGNOSIS — E519 Thiamine deficiency, unspecified: Secondary | ICD-10-CM

## 2024-08-28 NOTE — Progress Notes (Signed)
 Orders Placed This Encounter  Procedures   Ambulatory referral to Hematology / Oncology    Referral Priority:   Routine    Referral Type:   Consultation    Referral Reason:   Specialty Services Required    Requested Specialty:   Oncology    Number of Visits Requested:   1

## 2024-08-30 LAB — CMP14+EGFR
ALT: 13 IU/L (ref 0–32)
AST: 24 IU/L (ref 0–40)
Albumin: 3.9 g/dL (ref 3.8–4.9)
Alkaline Phosphatase: 172 IU/L — ABNORMAL HIGH (ref 49–135)
BUN/Creatinine Ratio: 15 (ref 12–28)
BUN: 10 mg/dL (ref 8–27)
Bilirubin Total: 0.2 mg/dL (ref 0.0–1.2)
CO2: 20 mmol/L (ref 20–29)
Calcium: 9.1 mg/dL (ref 8.7–10.3)
Chloride: 106 mmol/L (ref 96–106)
Creatinine, Ser: 0.68 mg/dL (ref 0.57–1.00)
Globulin, Total: 3 g/dL (ref 1.5–4.5)
Glucose: 96 mg/dL (ref 70–99)
Potassium: 4.5 mmol/L (ref 3.5–5.2)
Sodium: 140 mmol/L (ref 134–144)
Total Protein: 6.9 g/dL (ref 6.0–8.5)
eGFR: 100 mL/min/1.73 (ref >59)

## 2024-08-30 LAB — CBC WITH DIFFERENTIAL/PLATELET
Basophils Absolute: 0 x10E3/uL (ref 0.0–0.2)
Basos: 1 %
EOS (ABSOLUTE): 0.1 x10E3/uL (ref 0.0–0.4)
Eos: 2 %
Hematocrit: 24.5 % — ABNORMAL LOW (ref 34.0–46.6)
Hemoglobin: 6.1 g/dL — CL (ref 11.1–15.9)
Immature Grans (Abs): 0 x10E3/uL (ref 0.0–0.1)
Immature Granulocytes: 0 %
Lymphocytes Absolute: 1.5 x10E3/uL (ref 0.7–3.1)
Lymphs: 28 %
MCH: 17 pg — ABNORMAL LOW (ref 26.6–33.0)
MCHC: 24.9 g/dL — CL (ref 31.5–35.7)
MCV: 68 fL — ABNORMAL LOW (ref 79–97)
Monocytes Absolute: 0.6 x10E3/uL (ref 0.1–0.9)
Monocytes: 11 %
Neutrophils Absolute: 3.1 x10E3/uL (ref 1.4–7.0)
Neutrophils: 58 %
Platelets: 406 x10E3/uL (ref 150–450)
RBC: 3.59 x10E6/uL — ABNORMAL LOW (ref 3.77–5.28)
RDW: 20.3 % — ABNORMAL HIGH (ref 11.7–15.4)
WBC: 5.3 x10E3/uL (ref 3.4–10.8)

## 2024-08-30 LAB — LIPID PANEL WITH LDL/HDL RATIO
Cholesterol, Total: 191 mg/dL (ref 100–199)
HDL: 53 mg/dL (ref >39)
LDL Chol Calc (NIH): 118 mg/dL — ABNORMAL HIGH (ref 0–99)
LDL/HDL Ratio: 2.2 ratio (ref 0.0–3.2)
Triglycerides: 111 mg/dL (ref 0–149)
VLDL Cholesterol Cal: 20 mg/dL (ref 5–40)

## 2024-08-30 LAB — B12 AND FOLATE PANEL
Folate: 6.5 ng/mL (ref >3.0)
Vitamin B-12: 352 pg/mL (ref 232–1245)

## 2024-08-30 LAB — VITAMIN B6: Vitamin B6: 5 ug/L (ref 3.4–65.2)

## 2024-08-30 LAB — IRON,TIBC AND FERRITIN PANEL
Ferritin: 3 ng/mL — ABNORMAL LOW (ref 15–150)
Iron Saturation: 2 % — CL (ref 15–55)
Iron: 10 ug/dL — ABNORMAL LOW (ref 27–159)
Total Iron Binding Capacity: 482 ug/dL — ABNORMAL HIGH (ref 250–450)
UIBC: 472 ug/dL — ABNORMAL HIGH (ref 131–425)

## 2024-08-30 LAB — VITAMIN B1: Thiamine: 67.7 nmol/L (ref 66.5–200.0)

## 2024-08-30 LAB — COPPER, SERUM: Copper: 129 ug/dL (ref 80–158)

## 2024-08-30 LAB — MAGNESIUM: Magnesium: 2.1 mg/dL (ref 1.6–2.3)

## 2024-08-30 LAB — TSH: TSH: 1.26 u[IU]/mL (ref 0.450–4.500)

## 2024-08-31 NOTE — Progress Notes (Signed)
 Call pt: B6 is on low end. Please start otc B6 tab daily.

## 2024-09-18 ENCOUNTER — Encounter: Payer: Self-pay | Admitting: Family

## 2024-09-18 ENCOUNTER — Inpatient Hospital Stay: Attending: Family | Admitting: Family

## 2024-09-18 ENCOUNTER — Inpatient Hospital Stay

## 2024-09-18 VITALS — BP 128/42 | HR 75 | Temp 99.2°F | Resp 18 | Ht 64.0 in | Wt 208.0 lb

## 2024-09-18 DIAGNOSIS — K909 Intestinal malabsorption, unspecified: Secondary | ICD-10-CM | POA: Diagnosis not present

## 2024-09-18 DIAGNOSIS — D508 Other iron deficiency anemias: Secondary | ICD-10-CM | POA: Diagnosis not present

## 2024-09-18 DIAGNOSIS — Z9884 Bariatric surgery status: Secondary | ICD-10-CM | POA: Diagnosis not present

## 2024-09-18 DIAGNOSIS — D509 Iron deficiency anemia, unspecified: Secondary | ICD-10-CM | POA: Insufficient documentation

## 2024-09-18 NOTE — Progress Notes (Signed)
 Hematology/Oncology Consultation   Name: Janet Galloway      MRN: [id number]    Location: Room/bed info not found  Date: 09/18/2024 Time:9:13 AM   REFERRING PHYSICIAN: Dorothyann Byars, MD  REASON FOR CONSULT: Iron deficiency anemia    DIAGNOSIS: Iron deficiency anemia   HISTORY OF PRESENT ILLNESS: Janet Galloway is a pleasant 63 yo African American female with history of IDA.  She has tried taking oral iron in the past with no improvement. This caused severe constipation.  She has not noted any blood loss. No abnormal bruising or petechiae.  She went through menopause naturally. Reproductive organs are still in place.  She had laparoscopic sleeve gastric bypass in 2015.  She has tried increasing her iron intake without any improvement.  She has not received blood or IV iron before.  Iron saturation is 2% and ferritin 3. Hgb 6.1, MCV 68, WBC count 5.3 and platelets 406.  No known familial history of anemia.  No sickle cell trait.  She is mildly symptomatic with fatigue and lightheadedness when standing.  She is currently on methotrexate  and folic acid  for RA.  No history of diabetes or thyroid disease.  No personal history of cancer. Her maternal aunt had breast cancer.  She gets her mammogram annually and is due again in November. Last mammogram in 09/2023 was negative.  Colonoscopy last year showed pancolonic diverticulosis and she had 1 small polyp removed from the descending colon. She  is due every 5 years due to family history of multiple colonic polyps.  No fever, chills, n/v, cough, rash, SOB, chest pain, palpitations, abdominal pain or changes in bowel or bladder habits.  No swelling, tenderness, numbness or tingling in her extremities.  No falls or syncope reported.  She states that her insomnia is from job stress.  She works as a emergency planning/management officer.  No smoking, ETOH or recreational drug use.   ROS: All other 10 point review of systems is negative.   PAST MEDICAL  HISTORY:   Past Medical History:  Diagnosis Date   Arthritis    Hyperlipidemia     ALLERGIES: Allergies  Allergen Reactions   Acetaminophen Itching    Had abnormal LFTs in past with using tylenol in large amounts for RA      MEDICATIONS:  Current Outpatient Medications on File Prior to Visit  Medication Sig Dispense Refill   folic acid  (FOLVITE ) 1 MG tablet Take 1 tablet (1 mg total) by mouth daily. 90 tablet 3   methotrexate  (RHEUMATREX) 2.5 MG tablet Take 4 tablets (10 mg total) by mouth once a week. Caution:Chemotherapy. Protect from light. 120 tablet 1   No current facility-administered medications on file prior to visit.     PAST SURGICAL HISTORY Past Surgical History:  Procedure Laterality Date   LAPAROSCOPIC GASTRIC SLEEVE RESECTION  11/2013    FAMILY HISTORY: Family History  Problem Relation Age of Onset   Diabetes Maternal Aunt    Breast cancer Maternal Aunt        breast    SOCIAL HISTORY:  reports that she has never smoked. She has never used smokeless tobacco. She reports that she does not drink alcohol and does not use drugs.  PERFORMANCE STATUS: [name] performance status is 1 - Symptomatic but completely ambulatory  PHYSICAL EXAM: Most Recent Vital Signs: Blood pressure (!) 128/42, pulse 75, temperature 99.2 F (37.3 C), temperature source Oral, resp. rate 18, height 5' 4 (1.626 m), weight 208 lb (94.3 kg), SpO2 100%. BP ROLLEN)  128/42 (BP Location: Right Arm, Patient Position: Sitting)   Pulse 75   Temp 99.2 F (37.3 C) (Oral)   Resp 18   Ht 5' 4 (1.626 m)   Wt 208 lb (94.3 kg)   SpO2 100%   BMI 35.70 kg/m   General Appearance:    Alert, cooperative, no distress, appears stated age  Head:    Normocephalic, without obvious abnormality, atraumatic  Eyes:    PERRL, conjunctiva/corneas clear, EOM's intact, fundi    benign, both eyes        Throat:   Lips, mucosa, and tongue normal; teeth and gums normal  Neck:   Supple, symmetrical,  trachea midline, no adenopathy;    thyroid:  no enlargement/tenderness/nodules; no carotid   bruit or JVD  Back:     Symmetric, no curvature, ROM normal, no CVA tenderness  Lungs:     Clear to auscultation bilaterally, respirations unlabored  Chest Wall:    No tenderness or deformity   Heart:    Regular rate and rhythm, S1 and S2 normal, no murmur, rub   or gallop     Abdomen:     Soft, non-tender, bowel sounds active all four quadrants,    no masses, no organomegaly        Extremities:   Extremities normal, atraumatic, no cyanosis or edema  Pulses:   2+ and symmetric all extremities  Skin:   Skin color, texture, turgor normal, no rashes or lesions  Lymph nodes:   Cervical, supraclavicular, and axillary nodes normal  Neurologic:   CNII-XII intact, normal strength, sensation and reflexes    throughout    LABORATORY DATA:  No results found for this or any previous visit (from the past 48 hours).    RADIOGRAPHY: No results found.     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  We will get her set up for 3 doses of Venofer.  Follow-up in 6 weeks.   All questions were answered. The patient knows to call the clinic with any problems, questions or concerns. We can certainly see the patient much sooner if necessary.   Lauraine Pepper, NP

## 2024-09-21 ENCOUNTER — Inpatient Hospital Stay

## 2024-09-21 VITALS — BP 121/58 | HR 69 | Temp 98.7°F | Resp 16

## 2024-09-21 DIAGNOSIS — D508 Other iron deficiency anemias: Secondary | ICD-10-CM | POA: Diagnosis not present

## 2024-09-21 DIAGNOSIS — K909 Intestinal malabsorption, unspecified: Secondary | ICD-10-CM

## 2024-09-21 MED ORDER — IRON SUCROSE 300 MG IVPB - SIMPLE MED
300.0000 mg | Freq: Once | Status: AC
  Administered 2024-09-21: 300 mg via INTRAVENOUS
  Filled 2024-09-21: qty 300

## 2024-09-21 MED ORDER — SODIUM CHLORIDE 0.9 % IV SOLN: INTRAVENOUS | Status: DC

## 2024-09-21 NOTE — Patient Instructions (Signed)

## 2024-09-21 NOTE — Progress Notes (Signed)
 Pt. stayed 20 mins. for post treatment observation. Pt denies any complaints at this time.

## 2024-09-28 ENCOUNTER — Inpatient Hospital Stay: Attending: Hematology & Oncology

## 2024-09-28 VITALS — BP 117/65 | HR 57 | Temp 97.9°F | Resp 17

## 2024-09-28 DIAGNOSIS — D508 Other iron deficiency anemias: Secondary | ICD-10-CM | POA: Insufficient documentation

## 2024-09-28 DIAGNOSIS — Z9884 Bariatric surgery status: Secondary | ICD-10-CM | POA: Diagnosis not present

## 2024-09-28 DIAGNOSIS — K909 Intestinal malabsorption, unspecified: Secondary | ICD-10-CM | POA: Diagnosis present

## 2024-09-28 MED ORDER — SODIUM CHLORIDE 0.9 % IV SOLN: INTRAVENOUS | Status: DC

## 2024-09-28 MED ORDER — IRON SUCROSE 300 MG IVPB - SIMPLE MED
300.0000 mg | Freq: Once | Status: AC
  Administered 2024-09-28: 300 mg via INTRAVENOUS
  Filled 2024-09-28: qty 300

## 2024-09-28 NOTE — Patient Instructions (Signed)

## 2024-10-05 ENCOUNTER — Inpatient Hospital Stay

## 2024-10-05 VITALS — BP 120/72 | HR 70 | Temp 97.9°F | Resp 17 | Ht 64.0 in | Wt 208.0 lb

## 2024-10-05 DIAGNOSIS — D508 Other iron deficiency anemias: Secondary | ICD-10-CM

## 2024-10-05 DIAGNOSIS — K909 Intestinal malabsorption, unspecified: Secondary | ICD-10-CM

## 2024-10-05 MED ORDER — SODIUM CHLORIDE 0.9 % IV SOLN: INTRAVENOUS | Status: DC

## 2024-10-05 MED ORDER — IRON SUCROSE 300 MG IVPB - SIMPLE MED
300.0000 mg | Freq: Once | Status: AC
  Administered 2024-10-05: 300 mg via INTRAVENOUS
  Filled 2024-10-05: qty 300

## 2024-10-05 NOTE — Patient Instructions (Signed)

## 2024-10-05 NOTE — Progress Notes (Signed)
Pt declined to stay for post infusion observation period. Pt stated she has tolerated medication multiple times prior without difficulty. Pt aware to call clinic with any questions or concerns. Pt verbalized understanding and had no further questions.  ? ?

## 2024-11-06 ENCOUNTER — Inpatient Hospital Stay: Admitting: Family

## 2024-11-06 ENCOUNTER — Encounter: Payer: Self-pay | Admitting: Family

## 2024-11-06 ENCOUNTER — Inpatient Hospital Stay: Attending: Hematology & Oncology

## 2024-11-06 VITALS — BP 128/59 | HR 61 | Temp 98.3°F | Resp 18 | Ht 64.0 in | Wt 210.8 lb

## 2024-11-06 DIAGNOSIS — K909 Intestinal malabsorption, unspecified: Secondary | ICD-10-CM | POA: Insufficient documentation

## 2024-11-06 DIAGNOSIS — D508 Other iron deficiency anemias: Secondary | ICD-10-CM | POA: Diagnosis present

## 2024-11-06 DIAGNOSIS — Z9884 Bariatric surgery status: Secondary | ICD-10-CM | POA: Diagnosis not present

## 2024-11-06 LAB — CBC WITH DIFFERENTIAL (CANCER CENTER ONLY)
Abs Immature Granulocytes: 0.01 K/uL (ref 0.00–0.07)
Basophils Absolute: 0 K/uL (ref 0.0–0.1)
Basophils Relative: 1 %
Eosinophils Absolute: 0.3 K/uL (ref 0.0–0.5)
Eosinophils Relative: 6 %
HCT: 36.4 % (ref 36.0–46.0)
Hemoglobin: 10.8 g/dL — ABNORMAL LOW (ref 12.0–15.0)
Immature Granulocytes: 0 %
Lymphocytes Relative: 34 %
Lymphs Abs: 1.5 K/uL (ref 0.7–4.0)
MCH: 24.5 pg — ABNORMAL LOW (ref 26.0–34.0)
MCHC: 29.7 g/dL — ABNORMAL LOW (ref 30.0–36.0)
MCV: 82.5 fL (ref 80.0–100.0)
Monocytes Absolute: 0.5 K/uL (ref 0.1–1.0)
Monocytes Relative: 10 %
Neutro Abs: 2.1 K/uL (ref 1.7–7.7)
Neutrophils Relative %: 49 %
Platelet Count: 195 K/uL (ref 150–400)
RBC: 4.41 MIL/uL (ref 3.87–5.11)
WBC Count: 4.4 K/uL (ref 4.0–10.5)
nRBC: 0 % (ref 0.0–0.2)

## 2024-11-06 LAB — FERRITIN: Ferritin: 54 ng/mL (ref 11–307)

## 2024-11-06 LAB — RETICULOCYTES
Immature Retic Fract: 17 % — ABNORMAL HIGH (ref 2.3–15.9)
RBC.: 4.23 MIL/uL (ref 3.87–5.11)
Retic Count, Absolute: 28.3 K/uL (ref 19.0–186.0)
Retic Ct Pct: 0.7 % (ref 0.4–3.1)

## 2024-11-06 LAB — IRON AND IRON BINDING CAPACITY (CC-WL,HP ONLY)
Iron: 45 ug/dL (ref 28–170)
Saturation Ratios: 10 % — ABNORMAL LOW (ref 10.4–31.8)
TIBC: 463 ug/dL — ABNORMAL HIGH (ref 250–450)
UIBC: 418 ug/dL

## 2024-11-06 NOTE — Progress Notes (Signed)
 Hematology and Oncology Follow Up Visit  DARCEY CARDY [id number] 1961-08-14 63 y.o. 11/06/2024   Principle Diagnosis:  Iron  deficiency anemia   Current Therapy:   IV iron  as indicated    Interim History:  Ms. Moshier is here today for follow-up. She is doing well and notes that she is no longer craving ice, having dizziness or feeling as fatigued.  No obvious blood loss noted.  No fever, chills, n/v, cough, rash, dizziness, SOB, chest pain, palpitations, abdominal pain or changes in bowel or bladder habits.  No swelling, tenderness, numbness or tingling in her extremities.  No falls or syncope reported.  Appetite and hydration are good. Weight is stable at 210 lbs.   ECOG Performance Status: 1 - Symptomatic but completely ambulatory  Medications:  Allergies as of 11/06/2024       Reactions   Acetaminophen Itching   Had abnormal LFTs in past with using tylenol in large amounts for RA        Medication List        Accurate as of November 06, 2024  8:47 AM. If you have any questions, ask your nurse or doctor.          folic acid  1 MG tablet Commonly known as: FOLVITE  Take 1 tablet (1 mg total) by mouth daily.   methotrexate  2.5 MG tablet Commonly known as: RHEUMATREX Take 4 tablets (10 mg total) by mouth once a week. Caution:Chemotherapy. Protect from light.        Allergies: Allergies[1]  Past Medical History, Surgical history, Social history, and Family History were reviewed and updated.  Review of Systems: All other 10 point review of systems is negative.   Physical Exam:  height is 5' 4 (1.626 m) and weight is 210 lb 12.8 oz (95.6 kg). Her oral temperature is 98.3 F (36.8 C). Her blood pressure is 128/59 (abnormal) and her pulse is 61. Her respiration is 18 and oxygen saturation is 100%.   Wt Readings from Last 3 Encounters:  11/06/24 210 lb 12.8 oz (95.6 kg)  10/05/24 208 lb (94.3 kg)  09/18/24 208 lb (94.3 kg)    Ocular:  Sclerae unicteric, pupils equal, round and reactive to light Ear-nose-throat: Oropharynx clear, dentition fair Lymphatic: No cervical or supraclavicular adenopathy Lungs no rales or rhonchi, good excursion bilaterally Heart regular rate and rhythm, no murmur appreciated Abd soft, nontender, positive bowel sounds MSK no focal spinal tenderness, no joint edema Neuro: non-focal, well-oriented, appropriate affect Breasts: Deferred   Lab Results  Component Value Date   WBC 5.3 08/24/2024   HGB 6.1 (LL) 08/24/2024   HCT 24.5 (L) 08/24/2024   MCV 68 (L) 08/24/2024   PLT 406 08/24/2024   Lab Results  Component Value Date   FERRITIN 3 (L) 08/24/2024   IRON  10 (L) 08/24/2024   TIBC 482 (H) 08/24/2024   UIBC 472 (H) 08/24/2024   IRONPCTSAT 2 (LL) 08/24/2024   Lab Results  Component Value Date   RBC 3.59 (L) 08/24/2024   No results found for: KPAFRELGTCHN, LAMBDASER, KAPLAMBRATIO No results found for: IGGSERUM, IGA, IGMSERUM No results found for: STEPHANY CARLOTA BENSON MARKEL EARLA JOANNIE DOC VICK, SPEI   Chemistry      Component Value Date/Time   NA 140 08/24/2024 0934   K 4.5 08/24/2024 0934   CL 106 08/24/2024 0934   CO2 20 08/24/2024 0934   BUN 10 08/24/2024 0934   CREATININE 0.68 08/24/2024 0934   CREATININE 0.59 09/10/2022 0000  Component Value Date/Time   CALCIUM 9.1 08/24/2024 0934   ALKPHOS 172 (H) 08/24/2024 0934   AST 24 08/24/2024 0934   ALT 13 08/24/2024 0934   BILITOT 0.2 08/24/2024 0934       Impression and Plan: Ms. 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  Iron  studies are pending. We will replace again if needed.  Follow-up in 3 months.    Lauraine Pepper, NP 12/15/20258:47 AM     [1]  Allergies Allergen Reactions   Acetaminophen Itching    Had abnormal LFTs in past with using tylenol in large amounts for RA

## 2024-11-21 ENCOUNTER — Inpatient Hospital Stay

## 2024-11-21 VITALS — BP 137/75 | HR 65 | Temp 98.0°F | Resp 20

## 2024-11-21 DIAGNOSIS — D508 Other iron deficiency anemias: Secondary | ICD-10-CM | POA: Diagnosis not present

## 2024-11-21 DIAGNOSIS — K909 Intestinal malabsorption, unspecified: Secondary | ICD-10-CM

## 2024-11-21 MED ORDER — SODIUM CHLORIDE 0.9 % IV SOLN: INTRAVENOUS | Status: DC

## 2024-11-21 MED ORDER — IRON SUCROSE 300 MG IVPB - SIMPLE MED
300.0000 mg | Freq: Once | Status: AC
  Administered 2024-11-21: 300 mg via INTRAVENOUS
  Filled 2024-11-21: qty 300

## 2024-11-21 MED ORDER — FAMOTIDINE IN NACL 20-0.9 MG/50ML-% IV SOLN: 20.0000 mg | Freq: Once | INTRAVENOUS | Status: DC | PRN

## 2024-11-21 MED ORDER — SODIUM CHLORIDE 0.9 % IV SOLN: Freq: Once | INTRAVENOUS | Status: DC | PRN

## 2024-11-21 MED ORDER — METHYLPREDNISOLONE SODIUM SUCC 125 MG IJ SOLR: 125.0000 mg | Freq: Once | INTRAMUSCULAR | Status: DC | PRN

## 2024-11-21 MED ORDER — EPINEPHRINE 0.3 MG/0.3ML IJ SOAJ: 0.3000 mg | Freq: Once | INTRAMUSCULAR | Status: DC | PRN

## 2024-11-21 MED ORDER — DIPHENHYDRAMINE HCL 50 MG/ML IJ SOLN: 50.0000 mg | Freq: Once | INTRAMUSCULAR | Status: DC | PRN

## 2024-11-21 MED ORDER — ALBUTEROL SULFATE HFA 108 (90 BASE) MCG/ACT IN AERS: 2.0000 | INHALATION_SPRAY | Freq: Once | RESPIRATORY_TRACT | Status: DC | PRN

## 2024-11-21 NOTE — Patient Instructions (Signed)

## 2024-11-28 ENCOUNTER — Inpatient Hospital Stay: Attending: Hematology & Oncology

## 2024-11-28 VITALS — BP 110/51 | HR 62 | Temp 98.3°F | Resp 18

## 2024-11-28 DIAGNOSIS — K909 Intestinal malabsorption, unspecified: Secondary | ICD-10-CM | POA: Diagnosis present

## 2024-11-28 DIAGNOSIS — Z9884 Bariatric surgery status: Secondary | ICD-10-CM | POA: Insufficient documentation

## 2024-11-28 DIAGNOSIS — D508 Other iron deficiency anemias: Secondary | ICD-10-CM | POA: Diagnosis present

## 2024-11-28 MED ORDER — IRON SUCROSE 300 MG IVPB - SIMPLE MED
300.0000 mg | Freq: Once | Status: AC
  Administered 2024-11-28: 300 mg via INTRAVENOUS
  Filled 2024-11-28: qty 300

## 2024-11-28 MED ORDER — SODIUM CHLORIDE 0.9 % IV SOLN: INTRAVENOUS | Status: DC

## 2024-11-28 NOTE — Progress Notes (Signed)
Pt refused to stay for 30 minutes post iron infusion and is without complaints at time of discharge.

## 2024-11-28 NOTE — Patient Instructions (Signed)

## 2024-11-30 ENCOUNTER — Other Ambulatory Visit: Payer: Self-pay | Admitting: Family Medicine

## 2024-11-30 DIAGNOSIS — Z1231 Encounter for screening mammogram for malignant neoplasm of breast: Secondary | ICD-10-CM

## 2024-12-05 ENCOUNTER — Inpatient Hospital Stay

## 2024-12-05 VITALS — BP 124/54 | HR 65 | Temp 98.3°F | Resp 18

## 2024-12-05 DIAGNOSIS — D508 Other iron deficiency anemias: Secondary | ICD-10-CM

## 2024-12-05 DIAGNOSIS — K909 Intestinal malabsorption, unspecified: Secondary | ICD-10-CM

## 2024-12-05 MED ORDER — SODIUM CHLORIDE 0.9 % IV SOLN: INTRAVENOUS | Status: DC

## 2024-12-05 MED ORDER — IRON SUCROSE 300 MG IVPB - SIMPLE MED
300.0000 mg | Freq: Once | Status: AC
  Administered 2024-12-05: 300 mg via INTRAVENOUS
  Filled 2024-12-05: qty 300

## 2024-12-05 NOTE — Patient Instructions (Signed)

## 2024-12-05 NOTE — Progress Notes (Signed)
Patient refused to wait 30 minutes post infusion. Released stable and ASX. 

## 2025-02-07 ENCOUNTER — Inpatient Hospital Stay

## 2025-02-07 ENCOUNTER — Inpatient Hospital Stay: Admitting: Family

## 2025-02-07 ENCOUNTER — Ambulatory Visit
